# Patient Record
Sex: Female | Born: 1955
Health system: Southern US, Community
[De-identification: ages and names within clinical notes are randomized; demographics above are authoritative.]

## PROBLEM LIST (undated history)

## (undated) DIAGNOSIS — M1990 Unspecified osteoarthritis, unspecified site: Secondary | ICD-10-CM

## (undated) DIAGNOSIS — I10 Essential (primary) hypertension: Secondary | ICD-10-CM

## (undated) HISTORY — PX: TONSILLECTOMY: SUR1361

## (undated) HISTORY — PX: CATARACT EXTRACTION W/ INTRAOCULAR LENS IMPLANT: SHX1309

---

## 2017-12-25 ENCOUNTER — Encounter (INDEPENDENT_AMBULATORY_CARE_PROVIDER_SITE_OTHER): Payer: Self-pay | Admitting: *Deleted

## 2018-12-27 DIAGNOSIS — I10 Essential (primary) hypertension: Secondary | ICD-10-CM | POA: Diagnosis not present

## 2018-12-27 DIAGNOSIS — R7301 Impaired fasting glucose: Secondary | ICD-10-CM | POA: Diagnosis not present

## 2018-12-27 DIAGNOSIS — J309 Allergic rhinitis, unspecified: Secondary | ICD-10-CM | POA: Diagnosis not present

## 2018-12-27 DIAGNOSIS — E782 Mixed hyperlipidemia: Secondary | ICD-10-CM | POA: Diagnosis not present

## 2019-06-13 DIAGNOSIS — M25561 Pain in right knee: Secondary | ICD-10-CM | POA: Diagnosis not present

## 2019-06-13 DIAGNOSIS — M1711 Unilateral primary osteoarthritis, right knee: Secondary | ICD-10-CM | POA: Diagnosis not present

## 2019-06-17 DIAGNOSIS — Z23 Encounter for immunization: Secondary | ICD-10-CM | POA: Diagnosis not present

## 2019-06-27 DIAGNOSIS — E782 Mixed hyperlipidemia: Secondary | ICD-10-CM | POA: Diagnosis not present

## 2019-06-27 DIAGNOSIS — R7301 Impaired fasting glucose: Secondary | ICD-10-CM | POA: Diagnosis not present

## 2019-06-27 DIAGNOSIS — I10 Essential (primary) hypertension: Secondary | ICD-10-CM | POA: Diagnosis not present

## 2019-07-02 ENCOUNTER — Other Ambulatory Visit (HOSPITAL_COMMUNITY): Payer: Self-pay | Admitting: Internal Medicine

## 2019-07-02 ENCOUNTER — Other Ambulatory Visit (HOSPITAL_COMMUNITY): Payer: Self-pay | Admitting: Adult Health Nurse Practitioner

## 2019-07-02 DIAGNOSIS — Z13820 Encounter for screening for osteoporosis: Secondary | ICD-10-CM

## 2019-07-02 DIAGNOSIS — R7303 Prediabetes: Secondary | ICD-10-CM | POA: Diagnosis not present

## 2019-07-02 DIAGNOSIS — R7301 Impaired fasting glucose: Secondary | ICD-10-CM | POA: Diagnosis not present

## 2019-07-02 DIAGNOSIS — E782 Mixed hyperlipidemia: Secondary | ICD-10-CM | POA: Diagnosis not present

## 2019-07-02 DIAGNOSIS — Z1231 Encounter for screening mammogram for malignant neoplasm of breast: Secondary | ICD-10-CM

## 2019-07-02 DIAGNOSIS — J302 Other seasonal allergic rhinitis: Secondary | ICD-10-CM | POA: Diagnosis not present

## 2019-07-02 DIAGNOSIS — I10 Essential (primary) hypertension: Secondary | ICD-10-CM | POA: Diagnosis not present

## 2019-07-02 DIAGNOSIS — M1711 Unilateral primary osteoarthritis, right knee: Secondary | ICD-10-CM | POA: Diagnosis not present

## 2019-07-09 ENCOUNTER — Ambulatory Visit (HOSPITAL_COMMUNITY)
Admission: RE | Admit: 2019-07-09 | Discharge: 2019-07-09 | Disposition: A | Discharge status: Home / Self Care | Payer: Medicare Other | Source: Ambulatory Visit | Attending: Adult Health Nurse Practitioner | Admitting: Adult Health Nurse Practitioner

## 2019-07-09 ENCOUNTER — Other Ambulatory Visit: Payer: Self-pay

## 2019-07-09 DIAGNOSIS — M85851 Other specified disorders of bone density and structure, right thigh: Secondary | ICD-10-CM | POA: Diagnosis not present

## 2019-07-09 DIAGNOSIS — Z13820 Encounter for screening for osteoporosis: Secondary | ICD-10-CM | POA: Insufficient documentation

## 2019-07-09 DIAGNOSIS — Z780 Asymptomatic menopausal state: Secondary | ICD-10-CM | POA: Diagnosis not present

## 2019-09-26 DIAGNOSIS — Z23 Encounter for immunization: Secondary | ICD-10-CM | POA: Diagnosis not present

## 2019-10-25 DIAGNOSIS — Z23 Encounter for immunization: Secondary | ICD-10-CM | POA: Diagnosis not present

## 2019-12-31 DIAGNOSIS — I10 Essential (primary) hypertension: Secondary | ICD-10-CM | POA: Diagnosis not present

## 2019-12-31 DIAGNOSIS — E782 Mixed hyperlipidemia: Secondary | ICD-10-CM | POA: Diagnosis not present

## 2019-12-31 DIAGNOSIS — Z0001 Encounter for general adult medical examination with abnormal findings: Secondary | ICD-10-CM | POA: Diagnosis not present

## 2019-12-31 DIAGNOSIS — M8589 Other specified disorders of bone density and structure, multiple sites: Secondary | ICD-10-CM | POA: Diagnosis not present

## 2019-12-31 DIAGNOSIS — R7301 Impaired fasting glucose: Secondary | ICD-10-CM | POA: Diagnosis not present

## 2019-12-31 DIAGNOSIS — M1711 Unilateral primary osteoarthritis, right knee: Secondary | ICD-10-CM | POA: Diagnosis not present

## 2019-12-31 DIAGNOSIS — J302 Other seasonal allergic rhinitis: Secondary | ICD-10-CM | POA: Diagnosis not present

## 2019-12-31 DIAGNOSIS — R944 Abnormal results of kidney function studies: Secondary | ICD-10-CM | POA: Diagnosis not present

## 2019-12-31 DIAGNOSIS — R7303 Prediabetes: Secondary | ICD-10-CM | POA: Diagnosis not present

## 2020-01-25 LAB — COLOGUARD: COLOGUARD: NEGATIVE

## 2020-02-12 ENCOUNTER — Ambulatory Visit (HOSPITAL_COMMUNITY): Payer: Medicare Other

## 2020-03-02 ENCOUNTER — Other Ambulatory Visit: Payer: Self-pay

## 2020-03-02 ENCOUNTER — Ambulatory Visit (HOSPITAL_COMMUNITY)
Admission: RE | Admit: 2020-03-02 | Discharge: 2020-03-02 | Disposition: A | Discharge status: Home / Self Care | Payer: Medicare Other | Source: Ambulatory Visit | Attending: Internal Medicine | Admitting: Internal Medicine

## 2020-03-02 DIAGNOSIS — Z1231 Encounter for screening mammogram for malignant neoplasm of breast: Secondary | ICD-10-CM | POA: Insufficient documentation

## 2020-06-09 DIAGNOSIS — Z23 Encounter for immunization: Secondary | ICD-10-CM | POA: Diagnosis not present

## 2020-06-25 DIAGNOSIS — Z23 Encounter for immunization: Secondary | ICD-10-CM | POA: Diagnosis not present

## 2020-07-02 DIAGNOSIS — Z6826 Body mass index (BMI) 26.0-26.9, adult: Secondary | ICD-10-CM | POA: Diagnosis not present

## 2020-07-02 DIAGNOSIS — R7301 Impaired fasting glucose: Secondary | ICD-10-CM | POA: Diagnosis not present

## 2020-07-02 DIAGNOSIS — I10 Essential (primary) hypertension: Secondary | ICD-10-CM | POA: Diagnosis not present

## 2020-07-02 DIAGNOSIS — J309 Allergic rhinitis, unspecified: Secondary | ICD-10-CM | POA: Diagnosis not present

## 2020-07-02 DIAGNOSIS — M8589 Other specified disorders of bone density and structure, multiple sites: Secondary | ICD-10-CM | POA: Diagnosis not present

## 2020-07-02 DIAGNOSIS — R319 Hematuria, unspecified: Secondary | ICD-10-CM | POA: Diagnosis not present

## 2020-07-02 DIAGNOSIS — J302 Other seasonal allergic rhinitis: Secondary | ICD-10-CM | POA: Diagnosis not present

## 2020-07-02 DIAGNOSIS — R7303 Prediabetes: Secondary | ICD-10-CM | POA: Diagnosis not present

## 2020-07-02 DIAGNOSIS — M8588 Other specified disorders of bone density and structure, other site: Secondary | ICD-10-CM | POA: Diagnosis not present

## 2020-07-02 DIAGNOSIS — E782 Mixed hyperlipidemia: Secondary | ICD-10-CM | POA: Diagnosis not present

## 2020-07-02 DIAGNOSIS — N390 Urinary tract infection, site not specified: Secondary | ICD-10-CM | POA: Diagnosis not present

## 2020-07-02 DIAGNOSIS — R944 Abnormal results of kidney function studies: Secondary | ICD-10-CM | POA: Diagnosis not present

## 2020-07-07 DIAGNOSIS — R7301 Impaired fasting glucose: Secondary | ICD-10-CM | POA: Diagnosis not present

## 2020-07-07 DIAGNOSIS — E782 Mixed hyperlipidemia: Secondary | ICD-10-CM | POA: Diagnosis not present

## 2020-07-07 DIAGNOSIS — N1831 Chronic kidney disease, stage 3a: Secondary | ICD-10-CM | POA: Diagnosis not present

## 2020-07-07 DIAGNOSIS — R7303 Prediabetes: Secondary | ICD-10-CM | POA: Diagnosis not present

## 2020-07-07 DIAGNOSIS — I129 Hypertensive chronic kidney disease with stage 1 through stage 4 chronic kidney disease, or unspecified chronic kidney disease: Secondary | ICD-10-CM | POA: Diagnosis not present

## 2020-07-07 DIAGNOSIS — M8589 Other specified disorders of bone density and structure, multiple sites: Secondary | ICD-10-CM | POA: Diagnosis not present

## 2020-07-07 DIAGNOSIS — J302 Other seasonal allergic rhinitis: Secondary | ICD-10-CM | POA: Diagnosis not present

## 2020-07-07 DIAGNOSIS — M1711 Unilateral primary osteoarthritis, right knee: Secondary | ICD-10-CM | POA: Diagnosis not present

## 2021-01-01 DIAGNOSIS — R7303 Prediabetes: Secondary | ICD-10-CM | POA: Diagnosis not present

## 2021-01-01 DIAGNOSIS — I10 Essential (primary) hypertension: Secondary | ICD-10-CM | POA: Diagnosis not present

## 2021-01-07 DIAGNOSIS — N1831 Chronic kidney disease, stage 3a: Secondary | ICD-10-CM | POA: Diagnosis not present

## 2021-01-07 DIAGNOSIS — J302 Other seasonal allergic rhinitis: Secondary | ICD-10-CM | POA: Diagnosis not present

## 2021-01-07 DIAGNOSIS — M8589 Other specified disorders of bone density and structure, multiple sites: Secondary | ICD-10-CM | POA: Diagnosis not present

## 2021-01-07 DIAGNOSIS — E782 Mixed hyperlipidemia: Secondary | ICD-10-CM | POA: Diagnosis not present

## 2021-01-07 DIAGNOSIS — R7301 Impaired fasting glucose: Secondary | ICD-10-CM | POA: Diagnosis not present

## 2021-01-07 DIAGNOSIS — I129 Hypertensive chronic kidney disease with stage 1 through stage 4 chronic kidney disease, or unspecified chronic kidney disease: Secondary | ICD-10-CM | POA: Diagnosis not present

## 2021-01-07 DIAGNOSIS — M1711 Unilateral primary osteoarthritis, right knee: Secondary | ICD-10-CM | POA: Diagnosis not present

## 2021-06-09 DIAGNOSIS — Z23 Encounter for immunization: Secondary | ICD-10-CM | POA: Diagnosis not present

## 2021-06-25 DIAGNOSIS — Z23 Encounter for immunization: Secondary | ICD-10-CM | POA: Diagnosis not present

## 2021-07-05 DIAGNOSIS — M8589 Other specified disorders of bone density and structure, multiple sites: Secondary | ICD-10-CM | POA: Diagnosis not present

## 2021-07-05 DIAGNOSIS — E782 Mixed hyperlipidemia: Secondary | ICD-10-CM | POA: Diagnosis not present

## 2021-07-12 ENCOUNTER — Other Ambulatory Visit (HOSPITAL_COMMUNITY): Payer: Self-pay | Admitting: Internal Medicine

## 2021-07-12 DIAGNOSIS — R7301 Impaired fasting glucose: Secondary | ICD-10-CM | POA: Diagnosis not present

## 2021-07-12 DIAGNOSIS — N1831 Chronic kidney disease, stage 3a: Secondary | ICD-10-CM | POA: Diagnosis not present

## 2021-07-12 DIAGNOSIS — Z0001 Encounter for general adult medical examination with abnormal findings: Secondary | ICD-10-CM | POA: Diagnosis not present

## 2021-07-12 DIAGNOSIS — M8589 Other specified disorders of bone density and structure, multiple sites: Secondary | ICD-10-CM

## 2021-07-12 DIAGNOSIS — E782 Mixed hyperlipidemia: Secondary | ICD-10-CM | POA: Diagnosis not present

## 2021-07-12 DIAGNOSIS — I129 Hypertensive chronic kidney disease with stage 1 through stage 4 chronic kidney disease, or unspecified chronic kidney disease: Secondary | ICD-10-CM | POA: Diagnosis not present

## 2021-07-12 DIAGNOSIS — Z1231 Encounter for screening mammogram for malignant neoplasm of breast: Secondary | ICD-10-CM

## 2021-07-12 DIAGNOSIS — M1711 Unilateral primary osteoarthritis, right knee: Secondary | ICD-10-CM | POA: Diagnosis not present

## 2021-07-12 DIAGNOSIS — J302 Other seasonal allergic rhinitis: Secondary | ICD-10-CM | POA: Diagnosis not present

## 2021-07-21 ENCOUNTER — Ambulatory Visit (HOSPITAL_COMMUNITY)
Admission: RE | Admit: 2021-07-21 | Discharge: 2021-07-21 | Disposition: A | Discharge status: Home / Self Care | Payer: Medicare Other | Source: Ambulatory Visit | Attending: Internal Medicine | Admitting: Internal Medicine

## 2021-07-21 ENCOUNTER — Other Ambulatory Visit: Payer: Self-pay

## 2021-07-21 DIAGNOSIS — Z1231 Encounter for screening mammogram for malignant neoplasm of breast: Secondary | ICD-10-CM | POA: Insufficient documentation

## 2021-07-22 ENCOUNTER — Ambulatory Visit (HOSPITAL_COMMUNITY)
Admission: RE | Admit: 2021-07-22 | Discharge: 2021-07-22 | Disposition: A | Discharge status: Home / Self Care | Payer: Medicare Other | Source: Ambulatory Visit | Attending: Internal Medicine | Admitting: Internal Medicine

## 2021-07-22 DIAGNOSIS — Z780 Asymptomatic menopausal state: Secondary | ICD-10-CM | POA: Diagnosis not present

## 2021-07-22 DIAGNOSIS — M8589 Other specified disorders of bone density and structure, multiple sites: Secondary | ICD-10-CM | POA: Insufficient documentation

## 2021-07-27 ENCOUNTER — Other Ambulatory Visit (HOSPITAL_COMMUNITY): Payer: Self-pay | Admitting: Internal Medicine

## 2021-07-27 DIAGNOSIS — R928 Other abnormal and inconclusive findings on diagnostic imaging of breast: Secondary | ICD-10-CM

## 2021-08-17 ENCOUNTER — Ambulatory Visit (HOSPITAL_COMMUNITY)
Admission: RE | Admit: 2021-08-17 | Discharge: 2021-08-17 | Disposition: A | Discharge status: Home / Self Care | Payer: Medicare Other | Source: Ambulatory Visit | Attending: Internal Medicine | Admitting: Internal Medicine

## 2021-08-17 ENCOUNTER — Other Ambulatory Visit: Payer: Self-pay

## 2021-08-17 DIAGNOSIS — R928 Other abnormal and inconclusive findings on diagnostic imaging of breast: Secondary | ICD-10-CM | POA: Insufficient documentation

## 2021-08-17 DIAGNOSIS — R922 Inconclusive mammogram: Secondary | ICD-10-CM | POA: Diagnosis not present

## 2021-09-14 DIAGNOSIS — H35033 Hypertensive retinopathy, bilateral: Secondary | ICD-10-CM | POA: Diagnosis not present

## 2021-09-28 DIAGNOSIS — H25811 Combined forms of age-related cataract, right eye: Secondary | ICD-10-CM | POA: Diagnosis not present

## 2021-09-28 DIAGNOSIS — Z01818 Encounter for other preprocedural examination: Secondary | ICD-10-CM | POA: Diagnosis not present

## 2021-09-28 DIAGNOSIS — H25812 Combined forms of age-related cataract, left eye: Secondary | ICD-10-CM | POA: Diagnosis not present

## 2021-09-28 DIAGNOSIS — H18523 Epithelial (juvenile) corneal dystrophy, bilateral: Secondary | ICD-10-CM | POA: Diagnosis not present

## 2021-10-07 DIAGNOSIS — H25811 Combined forms of age-related cataract, right eye: Secondary | ICD-10-CM | POA: Diagnosis not present

## 2021-10-21 DIAGNOSIS — H25812 Combined forms of age-related cataract, left eye: Secondary | ICD-10-CM | POA: Diagnosis not present

## 2021-10-21 DIAGNOSIS — H2512 Age-related nuclear cataract, left eye: Secondary | ICD-10-CM | POA: Diagnosis not present

## 2021-10-21 DIAGNOSIS — H25811 Combined forms of age-related cataract, right eye: Secondary | ICD-10-CM | POA: Diagnosis not present

## 2021-10-27 DIAGNOSIS — H2512 Age-related nuclear cataract, left eye: Secondary | ICD-10-CM | POA: Diagnosis not present

## 2022-01-06 DIAGNOSIS — E782 Mixed hyperlipidemia: Secondary | ICD-10-CM | POA: Diagnosis not present

## 2022-01-06 DIAGNOSIS — R7301 Impaired fasting glucose: Secondary | ICD-10-CM | POA: Diagnosis not present

## 2022-01-11 DIAGNOSIS — M8589 Other specified disorders of bone density and structure, multiple sites: Secondary | ICD-10-CM | POA: Diagnosis not present

## 2022-01-11 DIAGNOSIS — N1831 Chronic kidney disease, stage 3a: Secondary | ICD-10-CM | POA: Diagnosis not present

## 2022-01-11 DIAGNOSIS — E663 Overweight: Secondary | ICD-10-CM | POA: Diagnosis not present

## 2022-01-11 DIAGNOSIS — R7301 Impaired fasting glucose: Secondary | ICD-10-CM | POA: Diagnosis not present

## 2022-01-11 DIAGNOSIS — J302 Other seasonal allergic rhinitis: Secondary | ICD-10-CM | POA: Diagnosis not present

## 2022-01-11 DIAGNOSIS — I129 Hypertensive chronic kidney disease with stage 1 through stage 4 chronic kidney disease, or unspecified chronic kidney disease: Secondary | ICD-10-CM | POA: Diagnosis not present

## 2022-01-11 DIAGNOSIS — E782 Mixed hyperlipidemia: Secondary | ICD-10-CM | POA: Diagnosis not present

## 2022-01-11 DIAGNOSIS — Z6826 Body mass index (BMI) 26.0-26.9, adult: Secondary | ICD-10-CM | POA: Diagnosis not present

## 2022-01-11 DIAGNOSIS — M1711 Unilateral primary osteoarthritis, right knee: Secondary | ICD-10-CM | POA: Diagnosis not present

## 2022-01-12 ENCOUNTER — Other Ambulatory Visit: Payer: Self-pay | Admitting: Internal Medicine

## 2022-01-12 ENCOUNTER — Other Ambulatory Visit (HOSPITAL_COMMUNITY): Payer: Self-pay | Admitting: Internal Medicine

## 2022-01-12 DIAGNOSIS — E782 Mixed hyperlipidemia: Secondary | ICD-10-CM

## 2022-01-14 ENCOUNTER — Other Ambulatory Visit: Payer: Self-pay | Admitting: Internal Medicine

## 2022-01-14 ENCOUNTER — Other Ambulatory Visit (HOSPITAL_COMMUNITY): Payer: Self-pay | Admitting: Internal Medicine

## 2022-01-14 DIAGNOSIS — E782 Mixed hyperlipidemia: Secondary | ICD-10-CM

## 2022-02-21 ENCOUNTER — Inpatient Hospital Stay (HOSPITAL_COMMUNITY): Admission: RE | Admit: 2022-02-21 | Payer: Medicare Other | Source: Ambulatory Visit

## 2022-02-21 ENCOUNTER — Encounter (HOSPITAL_COMMUNITY): Payer: Self-pay

## 2022-03-10 DIAGNOSIS — H35033 Hypertensive retinopathy, bilateral: Secondary | ICD-10-CM | POA: Diagnosis not present

## 2022-05-04 ENCOUNTER — Ambulatory Visit (HOSPITAL_COMMUNITY)
Admission: RE | Admit: 2022-05-04 | Discharge: 2022-05-04 | Disposition: A | Discharge status: Home / Self Care | Payer: Medicare Other | Source: Ambulatory Visit | Attending: Internal Medicine | Admitting: Internal Medicine

## 2022-05-04 DIAGNOSIS — E782 Mixed hyperlipidemia: Secondary | ICD-10-CM | POA: Insufficient documentation

## 2022-05-28 DIAGNOSIS — Z23 Encounter for immunization: Secondary | ICD-10-CM | POA: Diagnosis not present

## 2022-06-29 IMAGING — MG MM DIGITAL DIAGNOSTIC UNILAT*L* W/ TOMO W/ CAD
4 series · 4 of 12 positions shown · non-contrast
Comparison: Previous exams.

CLINICAL DATA: Screening recall for possible left breast asymmetry.

EXAM:
DIGITAL DIAGNOSTIC UNILATERAL LEFT MAMMOGRAM WITH TOMOSYNTHESIS AND
CAD
TECHNIQUE: Left digital diagnostic mammography and breast tomosynthesis was
performed. The images were evaluated with computer-aided detection.

[L ML synth-2D]
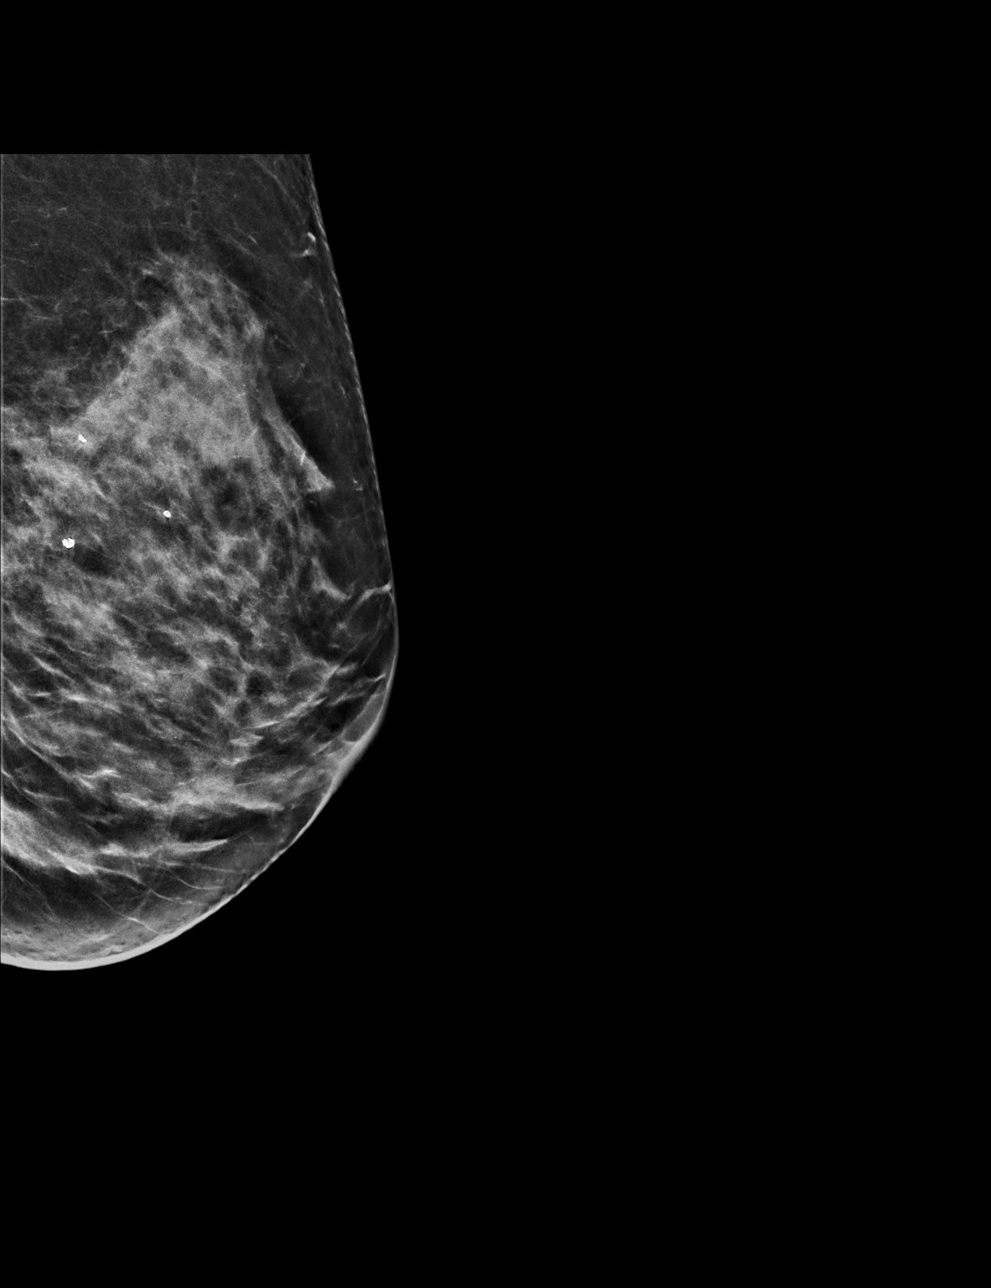

[L CC synth-2D]
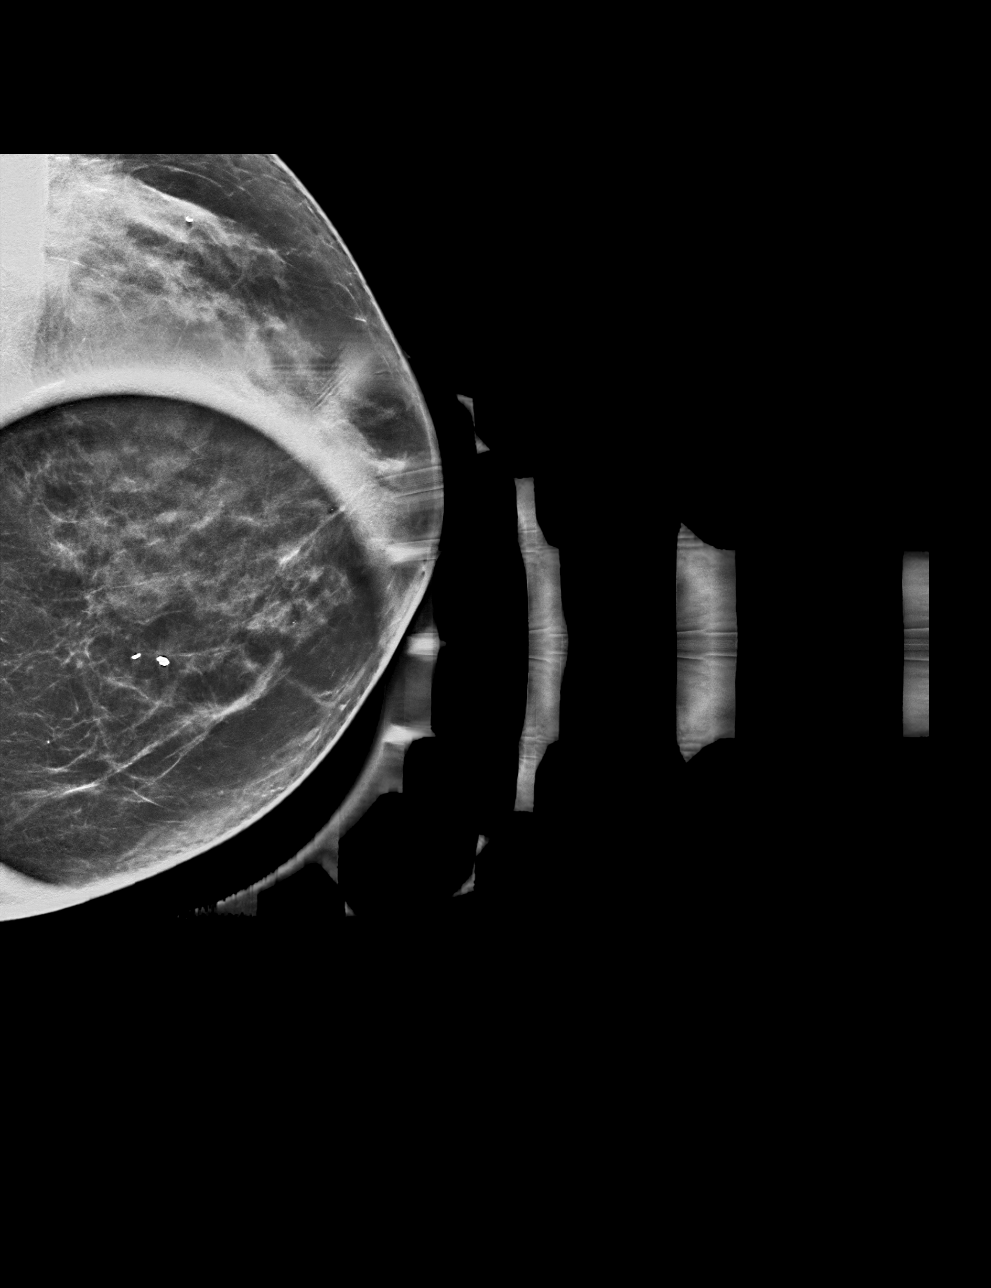

[L CC tomo · tomo slice 27/54.0]
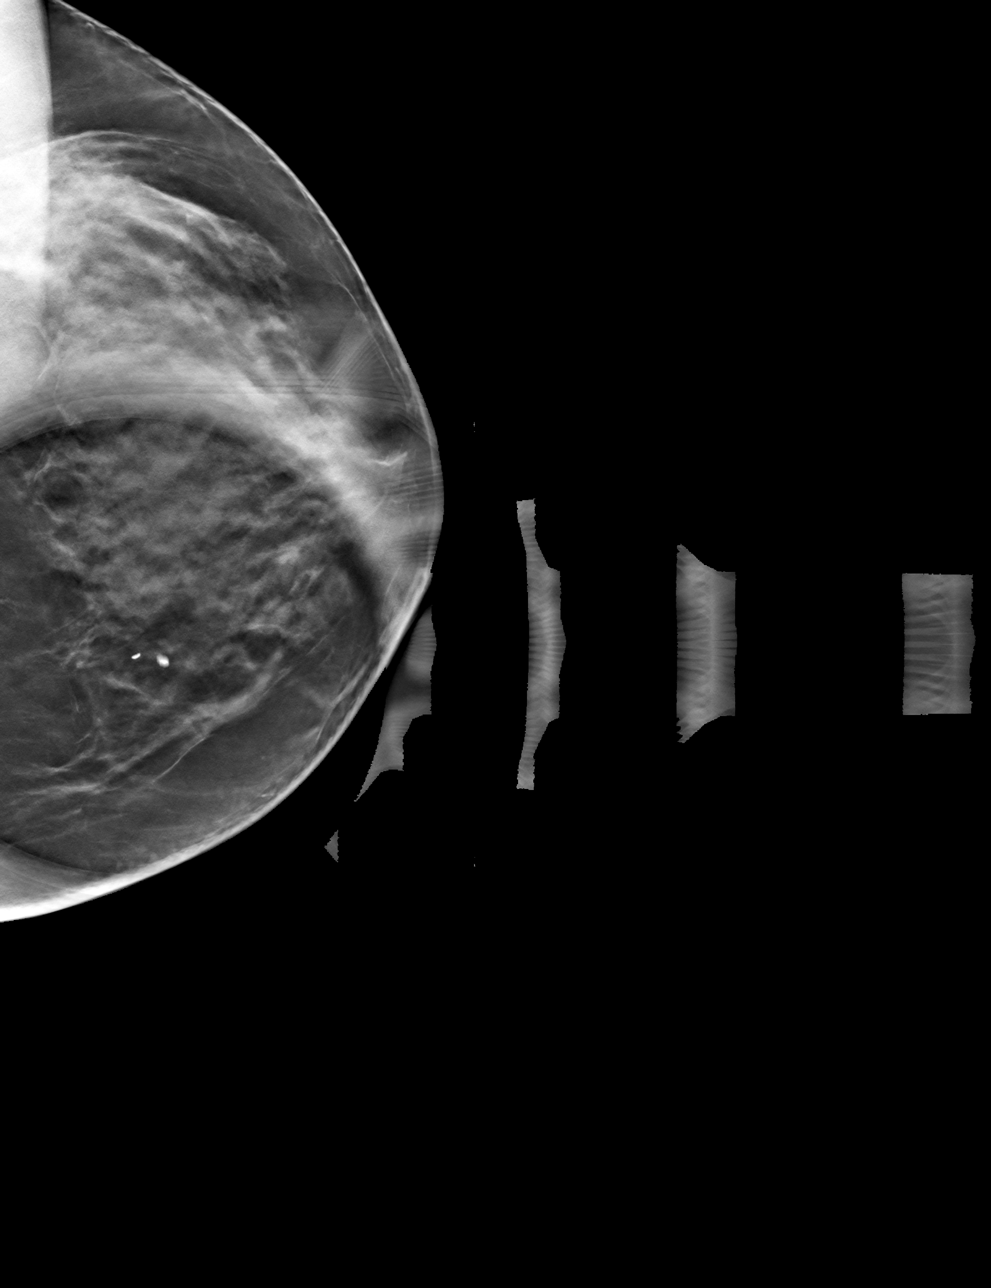

[L ML tomo · tomo slice 29/56.0]
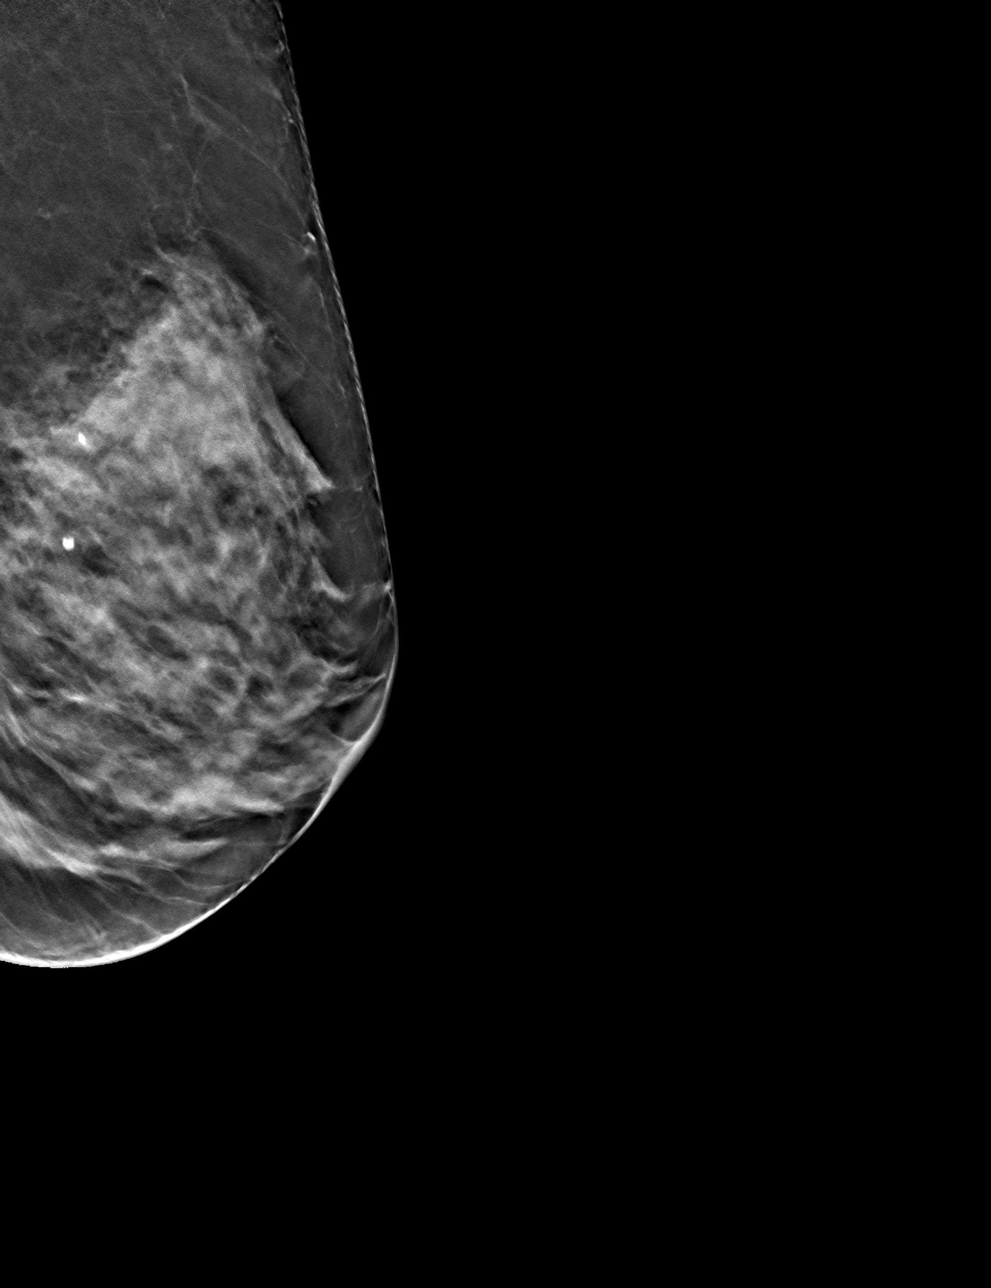

[4 of 12 positions shown; findings below may reference images not displayed]

ACR Breast Density Category c: The breast tissue is heterogeneously
dense, which may obscure small masses.
FINDINGS: Additional tomograms were performed of the left breast. The
initially questioned possible left breast asymmetry resolves on the
additional imaging with findings compatible with an area of
overlapping fibroglandular tissue. There is no mammographic evidence
of malignancy in the left breast.
IMPRESSION: No mammographic evidence of malignancy in the left breast.

RECOMMENDATION:
Screening mammogram in one year.(Code:[id number])

I have discussed the findings and recommendations with the patient.
If applicable, a reminder letter will be sent to the patient
regarding the next appointment.

BI-RADS CATEGORY  1: Negative.

## 2022-07-11 DIAGNOSIS — R7301 Impaired fasting glucose: Secondary | ICD-10-CM | POA: Diagnosis not present

## 2022-07-11 DIAGNOSIS — E782 Mixed hyperlipidemia: Secondary | ICD-10-CM | POA: Diagnosis not present

## 2022-07-15 DIAGNOSIS — M8589 Other specified disorders of bone density and structure, multiple sites: Secondary | ICD-10-CM | POA: Diagnosis not present

## 2022-07-15 DIAGNOSIS — R7301 Impaired fasting glucose: Secondary | ICD-10-CM | POA: Diagnosis not present

## 2022-07-15 DIAGNOSIS — J302 Other seasonal allergic rhinitis: Secondary | ICD-10-CM | POA: Diagnosis not present

## 2022-07-15 DIAGNOSIS — Z0000 Encounter for general adult medical examination without abnormal findings: Secondary | ICD-10-CM | POA: Diagnosis not present

## 2022-07-15 DIAGNOSIS — N1831 Chronic kidney disease, stage 3a: Secondary | ICD-10-CM | POA: Diagnosis not present

## 2022-07-15 DIAGNOSIS — M1711 Unilateral primary osteoarthritis, right knee: Secondary | ICD-10-CM | POA: Diagnosis not present

## 2022-07-15 DIAGNOSIS — I129 Hypertensive chronic kidney disease with stage 1 through stage 4 chronic kidney disease, or unspecified chronic kidney disease: Secondary | ICD-10-CM | POA: Diagnosis not present

## 2022-07-15 DIAGNOSIS — E782 Mixed hyperlipidemia: Secondary | ICD-10-CM | POA: Diagnosis not present

## 2022-07-28 DIAGNOSIS — M545 Low back pain, unspecified: Secondary | ICD-10-CM | POA: Diagnosis not present

## 2022-09-12 DIAGNOSIS — M5451 Vertebrogenic low back pain: Secondary | ICD-10-CM | POA: Diagnosis not present

## 2022-09-26 DIAGNOSIS — M545 Low back pain, unspecified: Secondary | ICD-10-CM | POA: Diagnosis not present

## 2022-09-26 DIAGNOSIS — M5451 Vertebrogenic low back pain: Secondary | ICD-10-CM | POA: Diagnosis not present

## 2022-10-03 DIAGNOSIS — M5416 Radiculopathy, lumbar region: Secondary | ICD-10-CM | POA: Diagnosis not present

## 2022-10-03 DIAGNOSIS — M47816 Spondylosis without myelopathy or radiculopathy, lumbar region: Secondary | ICD-10-CM | POA: Diagnosis not present

## 2022-10-19 DIAGNOSIS — M5416 Radiculopathy, lumbar region: Secondary | ICD-10-CM | POA: Diagnosis not present

## 2022-11-03 DIAGNOSIS — M5416 Radiculopathy, lumbar region: Secondary | ICD-10-CM | POA: Diagnosis not present

## 2022-11-03 DIAGNOSIS — M47816 Spondylosis without myelopathy or radiculopathy, lumbar region: Secondary | ICD-10-CM | POA: Diagnosis not present

## 2022-11-15 DIAGNOSIS — M5416 Radiculopathy, lumbar region: Secondary | ICD-10-CM | POA: Diagnosis not present

## 2022-11-21 DIAGNOSIS — M5416 Radiculopathy, lumbar region: Secondary | ICD-10-CM | POA: Diagnosis not present

## 2022-11-23 DIAGNOSIS — M5416 Radiculopathy, lumbar region: Secondary | ICD-10-CM | POA: Diagnosis not present

## 2022-11-28 DIAGNOSIS — M5416 Radiculopathy, lumbar region: Secondary | ICD-10-CM | POA: Diagnosis not present

## 2022-11-30 DIAGNOSIS — M5416 Radiculopathy, lumbar region: Secondary | ICD-10-CM | POA: Diagnosis not present

## 2022-12-05 DIAGNOSIS — M5416 Radiculopathy, lumbar region: Secondary | ICD-10-CM | POA: Diagnosis not present

## 2022-12-07 DIAGNOSIS — M5416 Radiculopathy, lumbar region: Secondary | ICD-10-CM | POA: Diagnosis not present

## 2022-12-12 DIAGNOSIS — M5416 Radiculopathy, lumbar region: Secondary | ICD-10-CM | POA: Diagnosis not present

## 2022-12-14 DIAGNOSIS — M5416 Radiculopathy, lumbar region: Secondary | ICD-10-CM | POA: Diagnosis not present

## 2022-12-19 DIAGNOSIS — M5416 Radiculopathy, lumbar region: Secondary | ICD-10-CM | POA: Diagnosis not present

## 2022-12-21 DIAGNOSIS — M5416 Radiculopathy, lumbar region: Secondary | ICD-10-CM | POA: Diagnosis not present

## 2022-12-26 DIAGNOSIS — M5416 Radiculopathy, lumbar region: Secondary | ICD-10-CM | POA: Diagnosis not present

## 2022-12-27 DIAGNOSIS — M5416 Radiculopathy, lumbar region: Secondary | ICD-10-CM | POA: Diagnosis not present

## 2022-12-27 DIAGNOSIS — M47896 Other spondylosis, lumbar region: Secondary | ICD-10-CM | POA: Diagnosis not present

## 2023-01-02 DIAGNOSIS — E782 Mixed hyperlipidemia: Secondary | ICD-10-CM | POA: Diagnosis not present

## 2023-01-02 DIAGNOSIS — R7301 Impaired fasting glucose: Secondary | ICD-10-CM | POA: Diagnosis not present

## 2023-01-03 ENCOUNTER — Other Ambulatory Visit (HOSPITAL_COMMUNITY): Payer: Self-pay | Admitting: Internal Medicine

## 2023-01-03 DIAGNOSIS — Z1231 Encounter for screening mammogram for malignant neoplasm of breast: Secondary | ICD-10-CM

## 2023-01-10 DIAGNOSIS — I10 Essential (primary) hypertension: Secondary | ICD-10-CM | POA: Diagnosis not present

## 2023-01-10 DIAGNOSIS — E782 Mixed hyperlipidemia: Secondary | ICD-10-CM | POA: Diagnosis not present

## 2023-01-10 DIAGNOSIS — R7303 Prediabetes: Secondary | ICD-10-CM | POA: Diagnosis not present

## 2023-01-12 DIAGNOSIS — M7138 Other bursal cyst, other site: Secondary | ICD-10-CM | POA: Diagnosis not present

## 2023-01-12 DIAGNOSIS — Z6826 Body mass index (BMI) 26.0-26.9, adult: Secondary | ICD-10-CM | POA: Diagnosis not present

## 2023-01-13 ENCOUNTER — Other Ambulatory Visit: Payer: Self-pay | Admitting: Neurosurgery

## 2023-01-13 ENCOUNTER — Ambulatory Visit (HOSPITAL_COMMUNITY)
Admission: RE | Admit: 2023-01-13 | Discharge: 2023-01-13 | Disposition: A | Discharge status: Home / Self Care | Payer: Medicare Other | Source: Ambulatory Visit | Attending: Internal Medicine | Admitting: Internal Medicine

## 2023-01-13 DIAGNOSIS — Z1231 Encounter for screening mammogram for malignant neoplasm of breast: Secondary | ICD-10-CM | POA: Diagnosis not present

## 2023-01-16 DIAGNOSIS — J302 Other seasonal allergic rhinitis: Secondary | ICD-10-CM | POA: Diagnosis not present

## 2023-01-16 DIAGNOSIS — I129 Hypertensive chronic kidney disease with stage 1 through stage 4 chronic kidney disease, or unspecified chronic kidney disease: Secondary | ICD-10-CM | POA: Diagnosis not present

## 2023-01-16 DIAGNOSIS — N1831 Chronic kidney disease, stage 3a: Secondary | ICD-10-CM | POA: Diagnosis not present

## 2023-01-16 DIAGNOSIS — R7303 Prediabetes: Secondary | ICD-10-CM | POA: Diagnosis not present

## 2023-01-16 DIAGNOSIS — M545 Low back pain, unspecified: Secondary | ICD-10-CM | POA: Diagnosis not present

## 2023-01-16 DIAGNOSIS — E782 Mixed hyperlipidemia: Secondary | ICD-10-CM | POA: Diagnosis not present

## 2023-01-16 DIAGNOSIS — R918 Other nonspecific abnormal finding of lung field: Secondary | ICD-10-CM | POA: Diagnosis not present

## 2023-01-16 DIAGNOSIS — I700 Atherosclerosis of aorta: Secondary | ICD-10-CM | POA: Diagnosis not present

## 2023-01-16 DIAGNOSIS — M1711 Unilateral primary osteoarthritis, right knee: Secondary | ICD-10-CM | POA: Diagnosis not present

## 2023-01-16 DIAGNOSIS — M8580 Other specified disorders of bone density and structure, unspecified site: Secondary | ICD-10-CM | POA: Diagnosis not present

## 2023-01-16 DIAGNOSIS — Z7182 Exercise counseling: Secondary | ICD-10-CM | POA: Diagnosis not present

## 2023-01-16 DIAGNOSIS — Z713 Dietary counseling and surveillance: Secondary | ICD-10-CM | POA: Diagnosis not present

## 2023-01-17 NOTE — Progress Notes (Signed)
     Your surgery and Pre-Admission testing visit will be at [facility] located at [address], [place], [place] [address].  Please let all your doctors (i.e., Primary Care Physician, Cardiologist, Endocrinologist, Pulmonologist) know you are having surgery. You may need clearance for surgery. If you are on blood thinners, notify your surgeon and ask the doctor who prescribed them how long to hold them before surgery.  If you have had a heart test, such as an EKG, stress test, heart ultrasound, etc., or lab work performed outside of [facility], please bring copies of these tests to your Pre-Admission testing, if possible.  These departments may contact you before the day of surgery:  Pre-Service Center - insurance/ billing: [phone] Pharmacy- to review your medications: [phone] Pre-Admission Testing- to set an appointment for your visit: [phone]  (Often, these numbers show up as "SPAM" on your phone)  The Pre-Admission Testing (PAT) visit focuses on Anesthesia for your upcoming surgery.  You do NOT need to fast; take your medications as usual. Please arrive 30 minutes early to allow for parking and admitting.  The visit may last up to an hour. Bring a photo ID and medical insurance card. Reschedule if you are sick. ([phone]) and please, NO children under age 16 at the visit.  During the PAT visit:  We will review your medical and surgical history.   You will receive pre-operative instructions, including the time of arrival at the hospital and surgical start time.  We will review what medication(s) you can take on the day of surgery.  After speaking with the nurse, you will have blood drawn and, if needed, a chest x-ray and EKG.  Most lab results from your doctor are good for 30 days, Hemoglobin A1C is good for 60 days. If you cannot talk to the Pharmacy, bring your medications or a list of them to the PST visit.   Infection control for the [facility] requires: All fingernail and toenail products should be removed before the day of surgery.  (SNS, Acrylic, Gel, Polish, Stickers, Press on, and Poly gel nails.)   Parking information:  Address: [facility] - [address], [place], [place] [address]  Please look for signs for entrance A off of [address]. Free valet parking is available Monday-Friday 05:30am-06:00pm     

## 2023-01-17 NOTE — Pre-Procedure Instructions (Signed)
Surgical Instructions    Your procedure is scheduled on January 23, 2023.  Report to [facility] Main Entrance "A" at 12:40 P.M., then check in with the Admitting office.  Call this number if you have problems the morning of surgery:  [phone]  If you have any questions prior to your surgery date call [phone]: Open Monday-Friday 8am-4pm If you experience any cold or flu symptoms such as cough, fever, chills, shortness of breath, etc. between now and your scheduled surgery, please notify us at the above number.     Remember:  Do not eat or drink after midnight the night before your surgery     Take these medicines the morning of surgery with A SIP OF WATER:  NONE   As of today, STOP taking any Aspirin (unless otherwise instructed by your surgeon) Aleve, Naproxen, Ibuprofen, Motrin, Advil, Goody's, BC's, all herbal medications, fish oil, and all vitamins.                     Do NOT Smoke (Tobacco/Vaping) for 24 hours prior to your procedure.  If you use a CPAP at night, you may bring your mask/headgear for your overnight stay.   Contacts, glasses, piercing's, hearing aid's, dentures or partials may not be worn into surgery, please bring cases for these belongings.    For patients admitted to the hospital, discharge time will be determined by your treatment team.   Patients discharged the day of surgery will not be allowed to drive home, and someone needs to stay with them for 24 hours.  SURGICAL WAITING ROOM VISITATION Patients having surgery or a procedure may have no more than 2 support people in the waiting area - these visitors may rotate.   Children under the age of 6 must have an adult with them who is not the patient. If the patient needs to stay at the hospital during part of their recovery, the visitor guidelines for inpatient rooms apply. Pre-op nurse will coordinate an appropriate time for 1 support person to accompany patient in pre-op.  This support person may  not rotate.   Please refer to the [facility] website for the visitor guidelines for Inpatients (after your surgery is over and you are in a regular room).   If you received a COVID test during your pre-op visit  it is requested that you wear a mask when out in public, stay away from anyone that may not be feeling well and notify your surgeon if you develop symptoms. If you have been in contact with anyone that has tested positive in the last 10 days please notify you surgeon.    Pre-operative 5 CHG Bath Instructions   You can play a key role in reducing the risk of infection after surgery. Your skin needs to be as free of germs as possible. You can reduce the number of germs on your skin by washing with CHG (chlorhexidine gluconate) soap before surgery. CHG is an antiseptic soap that kills germs and continues to kill germs even after washing.   DO NOT use if you have an allergy to chlorhexidine/CHG or antibacterial soaps. If your skin becomes reddened or irritated, stop using the CHG and notify one of our RNs at [phone].   Please shower with the CHG soap starting 4 days before surgery using the following schedule:     Please keep in mind the following:  DO NOT shave, including legs and underarms, starting the day of your first shower.  You may shave your face at any point before/day of surgery.  Place clean sheets on your bed the day you start using CHG soap. Use a clean washcloth (not used since being washed) for each shower. DO NOT sleep with pets once you start using the CHG.   CHG Shower Instructions:  If you choose to wash your hair and private area, wash first with your normal shampoo/soap.  After you use shampoo/soap, rinse your hair and body thoroughly to remove shampoo/soap residue.  Turn the water OFF and apply about 3 tablespoons (45 ml) of CHG soap to a CLEAN washcloth.  Apply CHG soap ONLY FROM YOUR NECK DOWN TO YOUR TOES (washing for 3-5 minutes)  DO NOT use CHG soap  on face, private areas, open wounds, or sores.  Pay special attention to the area where your surgery is being performed.  If you are having back surgery, having someone wash your back for you may be helpful. Wait 2 minutes after CHG soap is applied, then you may rinse off the CHG soap.  Pat dry with a clean towel  Put on clean clothes/pajamas   If you choose to wear lotion, please use ONLY the CHG-compatible lotions on the back of this paper.     Additional instructions for the day of surgery: DO NOT APPLY any lotions, deodorants, cologne, or perfumes.  Do not wear jewelry or makeup  Do not wear nail polish, gel polish, artificial nails, or any other type of covering on natural nails (fingers and toes) Do not bring valuables to the hospital. [facility] is not responsible for any belongings or valuables. Put on clean/comfortable clothes.  Brush your teeth.  Ask your nurse before applying any prescription medications to the skin.      CHG Compatible Lotions   Aveeno Moisturizing lotion  Cetaphil Moisturizing Cream  Cetaphil Moisturizing Lotion  Clairol Herbal Essence Moisturizing Lotion, Dry Skin  Clairol Herbal Essence Moisturizing Lotion, Extra Dry Skin  Clairol Herbal Essence Moisturizing Lotion, Normal Skin  Curel Age Defying Therapeutic Moisturizing Lotion with Alpha Hydroxy  Curel Extreme Care Body Lotion  Curel Soothing Hands Moisturizing Hand Lotion  Curel Therapeutic Moisturizing Cream, Fragrance-Free  Curel Therapeutic Moisturizing Lotion, Fragrance-Free  Curel Therapeutic Moisturizing Lotion, Original Formula  Eucerin Daily Replenishing Lotion  Eucerin Dry Skin Therapy Plus Alpha Hydroxy Crme  Eucerin Dry Skin Therapy Plus Alpha Hydroxy Lotion  Eucerin Original Crme  Eucerin Original Lotion  Eucerin Plus Crme Eucerin Plus Lotion  Eucerin TriLipid Replenishing Lotion  Keri Anti-Bacterial Hand Lotion  Keri Deep Conditioning Original Lotion Dry Skin Formula Softly  Scented  Keri Deep Conditioning Original Lotion, Fragrance Free Sensitive Skin Formula  Keri Lotion Fast Absorbing Fragrance Free Sensitive Skin Formula  Keri Lotion Fast Absorbing Softly Scented Dry Skin Formula  Keri Original Lotion  Keri Skin Renewal Lotion Keri Silky Smooth Lotion  Keri Silky Smooth Sensitive Skin Lotion  Nivea Body Creamy Conditioning Oil  Nivea Body Extra Enriched Lotion  Nivea Body Original Lotion  Nivea Body Sheer Moisturizing Lotion Nivea Crme  Nivea Skin Firming Lotion  NutraDerm 30 Skin Lotion  NutraDerm Skin Lotion  NutraDerm Therapeutic Skin Cream  NutraDerm Therapeutic Skin Lotion  ProShield Protective Hand Cream  Provon moisturizing lotion   Please read over the following fact sheets that you were given.

## 2023-01-18 ENCOUNTER — Other Ambulatory Visit: Payer: Self-pay

## 2023-01-18 ENCOUNTER — Encounter (HOSPITAL_COMMUNITY): Payer: Self-pay

## 2023-01-18 ENCOUNTER — Encounter (HOSPITAL_COMMUNITY)
Admission: RE | Admit: 2023-01-18 | Discharge: 2023-01-18 | Disposition: A | Discharge status: Home / Self Care | Payer: Medicare Other | Source: Ambulatory Visit | Attending: Neurosurgery | Admitting: Neurosurgery

## 2023-01-18 VITALS — BP 132/67 | HR 97 | Temp 98.5°F | Resp 18 | Ht 67.0 in | Wt 168.2 lb

## 2023-01-18 DIAGNOSIS — Z01818 Encounter for other preprocedural examination: Secondary | ICD-10-CM | POA: Diagnosis not present

## 2023-01-18 DIAGNOSIS — I2510 Atherosclerotic heart disease of native coronary artery without angina pectoris: Secondary | ICD-10-CM | POA: Insufficient documentation

## 2023-01-18 HISTORY — DX: Essential (primary) hypertension: I10

## 2023-01-18 HISTORY — DX: Unspecified osteoarthritis, unspecified site: M19.90

## 2023-01-18 LAB — BASIC METABOLIC PANEL
Anion gap: 10 (ref 5–15)
BUN: 22 mg/dL (ref 8–23)
CO2: 25 mmol/L (ref 22–32)
Calcium: 9.8 mg/dL (ref 8.9–10.3)
Chloride: 102 mmol/L (ref 98–111)
Creatinine, Ser: 0.92 mg/dL (ref 0.44–1.00)
GFR, Estimated: >60 mL/min (ref >60)
Glucose, Bld: 101 mg/dL — ABNORMAL HIGH (ref 70–99)
Potassium: 4.4 mmol/L (ref 3.5–5.1)
Sodium: 137 mmol/L (ref 135–145)

## 2023-01-18 LAB — CBC
HCT: 37.8 % (ref 36.0–46.0)
Hemoglobin: 12.3 g/dL (ref 12.0–15.0)
MCH: 28.5 pg (ref 26.0–34.0)
MCHC: 32.5 g/dL (ref 30.0–36.0)
MCV: 87.5 fL (ref 80.0–100.0)
Platelets: 295 10*3/uL (ref 150–400)
RBC: 4.32 MIL/uL (ref 3.87–5.11)
RDW: 13 % (ref 11.5–15.5)
WBC: 8.2 10*3/uL (ref 4.0–10.5)
nRBC: 0 % (ref 0.0–0.2)

## 2023-01-18 LAB — SURGICAL PCR SCREEN
MRSA, PCR: NEGATIVE
Staphylococcus aureus: NEGATIVE

## 2023-01-18 NOTE — Progress Notes (Signed)
PCP - Dr. Kathleene Hazel. Recruitment consultant - Denies  PPM/ICD - Denies Device Orders - n/a Rep Notified - n/a  Chest x-ray - Denies EKG - 01/18/2023 Stress Test - Denies ECHO - Denies Cardiac Cath - Denies  Sleep Study - Denies CPAP - n/a  No DM  Last dose of GLP1 agonist- n/a GLP1 instructions:  n/a  Blood Thinner Instructions: n/a Aspirin Instructions: n/a  NPO after midnight  COVID TEST- n/a   Anesthesia review: No.   Patient denies shortness of breath, fever, cough and chest pain at PAT appointment. Pt denies any respiratory illness/infection in the last two months.   All instructions explained to the patient, with a verbal understanding of the material. Patient agrees to go over the instructions while at home for a better understanding. Patient also instructed to self quarantine after being tested for COVID-19. The opportunity to ask questions was provided.

## 2023-01-23 ENCOUNTER — Observation Stay (HOSPITAL_COMMUNITY)
Admission: RE | Admit: 2023-01-23 | Discharge: 2023-01-23 | Disposition: A | Discharge status: Home / Self Care | Payer: Medicare Other | Attending: Neurosurgery | Admitting: Neurosurgery

## 2023-01-23 ENCOUNTER — Other Ambulatory Visit: Payer: Self-pay

## 2023-01-23 ENCOUNTER — Ambulatory Visit (HOSPITAL_COMMUNITY): Payer: Medicare Other

## 2023-01-23 ENCOUNTER — Encounter (HOSPITAL_COMMUNITY): Payer: Self-pay | Admitting: Neurosurgery

## 2023-01-23 ENCOUNTER — Ambulatory Visit (HOSPITAL_COMMUNITY)
Admission: RE | Disposition: A | Discharge status: Home / Self Care | Payer: Self-pay | Source: Home / Self Care | Attending: Neurosurgery

## 2023-01-23 ENCOUNTER — Ambulatory Visit (HOSPITAL_COMMUNITY): Payer: Medicare Other | Admitting: Certified Registered"

## 2023-01-23 ENCOUNTER — Ambulatory Visit (HOSPITAL_BASED_OUTPATIENT_CLINIC_OR_DEPARTMENT_OTHER): Payer: Medicare Other | Admitting: Certified Registered"

## 2023-01-23 DIAGNOSIS — I10 Essential (primary) hypertension: Secondary | ICD-10-CM

## 2023-01-23 DIAGNOSIS — M5416 Radiculopathy, lumbar region: Secondary | ICD-10-CM | POA: Diagnosis not present

## 2023-01-23 DIAGNOSIS — Z79899 Other long term (current) drug therapy: Secondary | ICD-10-CM | POA: Diagnosis not present

## 2023-01-23 DIAGNOSIS — M5417 Radiculopathy, lumbosacral region: Secondary | ICD-10-CM | POA: Diagnosis not present

## 2023-01-23 DIAGNOSIS — M7138 Other bursal cyst, other site: Secondary | ICD-10-CM | POA: Diagnosis not present

## 2023-01-23 DIAGNOSIS — M5136 Other intervertebral disc degeneration, lumbar region: Secondary | ICD-10-CM | POA: Diagnosis not present

## 2023-01-23 DIAGNOSIS — M5116 Intervertebral disc disorders with radiculopathy, lumbar region: Secondary | ICD-10-CM | POA: Diagnosis not present

## 2023-01-23 HISTORY — PX: LUMBAR LAMINECTOMY/DECOMPRESSION MICRODISCECTOMY: SHX5026

## 2023-01-23 SURGERY — LUMBAR LAMINECTOMY/DECOMPRESSION MICRODISCECTOMY 1 LEVEL
Anesthesia: General | Site: Back | Laterality: Left

## 2023-01-23 MED ORDER — ROCURONIUM BROMIDE 10 MG/ML (PF) SYRINGE
PREFILLED_SYRINGE | INTRAVENOUS | Status: DC | PRN
  Administered 2023-01-23 (×2): 50 mg via INTRAVENOUS

## 2023-01-23 MED ORDER — ACETAMINOPHEN 325 MG PO TABS: 650.0000 mg | ORAL_TABLET | ORAL | Status: DC | PRN

## 2023-01-23 MED ORDER — LACTATED RINGERS IV SOLN: INTRAVENOUS | Status: DC

## 2023-01-23 MED ORDER — BUPIVACAINE HCL (PF) 0.25 % IJ SOLN
INTRAMUSCULAR | Status: AC
  Filled 2023-01-23: qty 30

## 2023-01-23 MED ORDER — CYCLOBENZAPRINE HCL 10 MG PO TABS: 10.0000 mg | ORAL_TABLET | Freq: Three times a day (TID) | ORAL | 0 refills | Status: AC | PRN

## 2023-01-23 MED ORDER — SCOPOLAMINE 1 MG/3DAYS TD PT72: 1.0000 | MEDICATED_PATCH | TRANSDERMAL | Status: DC

## 2023-01-23 MED ORDER — IRBESARTAN 150 MG PO TABS
300.0000 mg | ORAL_TABLET | Freq: Every day | ORAL | Status: DC
  Administered 2023-01-23: 300 mg via ORAL
  Filled 2023-01-23: qty 2

## 2023-01-23 MED ORDER — PROPOFOL 10 MG/ML IV BOLUS
INTRAVENOUS | Status: DC | PRN
  Administered 2023-01-23: 130 mg via INTRAVENOUS

## 2023-01-23 MED ORDER — SODIUM CHLORIDE 0.9% FLUSH
3.0000 mL | Freq: Two times a day (BID) | INTRAVENOUS | Status: DC
  Administered 2023-01-23: 3 mL via INTRAVENOUS

## 2023-01-23 MED ORDER — PHENOL 1.4 % MT LIQD: 1.0000 | OROMUCOSAL | Status: DC | PRN

## 2023-01-23 MED ORDER — ROCURONIUM BROMIDE 10 MG/ML (PF) SYRINGE
PREFILLED_SYRINGE | INTRAVENOUS | Status: AC
  Filled 2023-01-23: qty 10

## 2023-01-23 MED ORDER — KETOROLAC TROMETHAMINE 30 MG/ML IJ SOLN
INTRAMUSCULAR | Status: DC | PRN
  Administered 2023-01-23: 30 mg via INTRAVENOUS

## 2023-01-23 MED ORDER — KETOROLAC TROMETHAMINE 30 MG/ML IJ SOLN
INTRAMUSCULAR | Status: AC
  Filled 2023-01-23: qty 1

## 2023-01-23 MED ORDER — ONDANSETRON HCL 4 MG/2ML IJ SOLN
INTRAMUSCULAR | Status: AC
  Filled 2023-01-23: qty 2

## 2023-01-23 MED ORDER — SODIUM CHLORIDE 0.9 % IV SOLN
250.0000 mL | INTRAVENOUS | Status: DC
  Administered 2023-01-23: 250 mL via INTRAVENOUS

## 2023-01-23 MED ORDER — CHLORHEXIDINE GLUCONATE 0.12 % MT SOLN
15.0000 mL | Freq: Once | OROMUCOSAL | Status: AC
  Administered 2023-01-23: 15 mL via OROMUCOSAL
  Filled 2023-01-23: qty 15

## 2023-01-23 MED ORDER — ORAL CARE MOUTH RINSE: 15.0000 mL | Freq: Once | OROMUCOSAL | Status: AC

## 2023-01-23 MED ORDER — ONDANSETRON HCL 4 MG/2ML IJ SOLN: 4.0000 mg | Freq: Four times a day (QID) | INTRAMUSCULAR | Status: DC | PRN

## 2023-01-23 MED ORDER — OXYCODONE HCL 5 MG/5ML PO SOLN: 5.0000 mg | Freq: Once | ORAL | Status: DC | PRN

## 2023-01-23 MED ORDER — 0.9 % SODIUM CHLORIDE (POUR BTL) OPTIME
TOPICAL | Status: DC | PRN
  Administered 2023-01-23: 1000 mL

## 2023-01-23 MED ORDER — BUPIVACAINE HCL (PF) 0.25 % IJ SOLN
INTRAMUSCULAR | Status: DC | PRN
  Administered 2023-01-23: 20 mL

## 2023-01-23 MED ORDER — HYDROMORPHONE HCL 1 MG/ML IJ SOLN: 0.2500 mg | INTRAMUSCULAR | Status: DC | PRN

## 2023-01-23 MED ORDER — CEFAZOLIN SODIUM-DEXTROSE 1-4 GM/50ML-% IV SOLN: 1.0000 g | Freq: Three times a day (TID) | INTRAVENOUS | Status: DC

## 2023-01-23 MED ORDER — ONDANSETRON HCL 4 MG/2ML IJ SOLN
INTRAMUSCULAR | Status: DC | PRN
  Administered 2023-01-23: 4 mg via INTRAVENOUS

## 2023-01-23 MED ORDER — DEXAMETHASONE SODIUM PHOSPHATE 10 MG/ML IJ SOLN
INTRAMUSCULAR | Status: DC | PRN
  Administered 2023-01-23: 5 mg via INTRAVENOUS

## 2023-01-23 MED ORDER — LIDOCAINE 2% (20 MG/ML) 5 ML SYRINGE
INTRAMUSCULAR | Status: AC
  Filled 2023-01-23: qty 5

## 2023-01-23 MED ORDER — PROPOFOL 10 MG/ML IV BOLUS
INTRAVENOUS | Status: AC
  Filled 2023-01-23: qty 20

## 2023-01-23 MED ORDER — HYDROMORPHONE HCL 1 MG/ML IJ SOLN: 1.0000 mg | INTRAMUSCULAR | Status: DC | PRN

## 2023-01-23 MED ORDER — CHLORHEXIDINE GLUCONATE CLOTH 2 % EX PADS: 6.0000 | MEDICATED_PAD | Freq: Once | CUTANEOUS | Status: DC

## 2023-01-23 MED ORDER — THROMBIN (RECOMBINANT) 5000 UNITS EX SOLR
CUTANEOUS | Status: DC | PRN
  Administered 2023-01-23: 1 via TOPICAL

## 2023-01-23 MED ORDER — ONDANSETRON HCL 4 MG PO TABS: 4.0000 mg | ORAL_TABLET | Freq: Four times a day (QID) | ORAL | Status: DC | PRN

## 2023-01-23 MED ORDER — FENTANYL CITRATE (PF) 250 MCG/5ML IJ SOLN
INTRAMUSCULAR | Status: DC | PRN
  Administered 2023-01-23 (×2): 50 ug via INTRAVENOUS

## 2023-01-23 MED ORDER — CENTRUM SILVER 50+WOMEN PO TABS: 1.0000 | ORAL_TABLET | Freq: Every day | ORAL | Status: DC

## 2023-01-23 MED ORDER — HYDROCODONE-ACETAMINOPHEN 10-325 MG PO TABS: 1.0000 | ORAL_TABLET | ORAL | Status: DC | PRN

## 2023-01-23 MED ORDER — CEFAZOLIN SODIUM-DEXTROSE 2-4 GM/100ML-% IV SOLN
2.0000 g | INTRAVENOUS | Status: AC
  Administered 2023-01-23: 2 g via INTRAVENOUS
  Filled 2023-01-23: qty 100

## 2023-01-23 MED ORDER — MENTHOL 3 MG MT LOZG: 1.0000 | LOZENGE | OROMUCOSAL | Status: DC | PRN

## 2023-01-23 MED ORDER — ACETAMINOPHEN 500 MG PO TABS
1000.0000 mg | ORAL_TABLET | Freq: Once | ORAL | Status: AC
  Administered 2023-01-23: 1000 mg via ORAL
  Filled 2023-01-23: qty 2

## 2023-01-23 MED ORDER — MEPERIDINE HCL 25 MG/ML IJ SOLN: 6.2500 mg | INTRAMUSCULAR | Status: DC | PRN

## 2023-01-23 MED ORDER — VASOPRESSIN 20 UNIT/ML IV SOLN
INTRAVENOUS | Status: AC
  Filled 2023-01-23: qty 1

## 2023-01-23 MED ORDER — KETOROLAC TROMETHAMINE 15 MG/ML IJ SOLN
30.0000 mg | Freq: Four times a day (QID) | INTRAMUSCULAR | Status: DC
  Administered 2023-01-23: 30 mg via INTRAVENOUS
  Filled 2023-01-23: qty 2

## 2023-01-23 MED ORDER — FENTANYL CITRATE (PF) 250 MCG/5ML IJ SOLN
INTRAMUSCULAR | Status: AC
  Filled 2023-01-23: qty 5

## 2023-01-23 MED ORDER — ADULT MULTIVITAMIN W/MINERALS CH: 1.0000 | ORAL_TABLET | Freq: Every day | ORAL | Status: DC

## 2023-01-23 MED ORDER — SUGAMMADEX SODIUM 200 MG/2ML IV SOLN
INTRAVENOUS | Status: DC | PRN
  Administered 2023-01-23: 200 mg via INTRAVENOUS

## 2023-01-23 MED ORDER — OLMESARTAN MEDOXOMIL-HCTZ 40-12.5 MG PO TABS: 1.0000 | ORAL_TABLET | Freq: Every day | ORAL | Status: DC

## 2023-01-23 MED ORDER — MIDAZOLAM HCL 2 MG/2ML IJ SOLN: 0.5000 mg | Freq: Once | INTRAMUSCULAR | Status: DC | PRN

## 2023-01-23 MED ORDER — PHENYLEPHRINE HCL-NACL 20-0.9 MG/250ML-% IV SOLN
INTRAVENOUS | Status: DC | PRN
  Administered 2023-01-23: 50 ug/min via INTRAVENOUS

## 2023-01-23 MED ORDER — HYDROCODONE-ACETAMINOPHEN 5-325 MG PO TABS: 1.0000 | ORAL_TABLET | ORAL | Status: DC | PRN

## 2023-01-23 MED ORDER — PHENYLEPHRINE 80 MCG/ML (10ML) SYRINGE FOR IV PUSH (FOR BLOOD PRESSURE SUPPORT)
PREFILLED_SYRINGE | INTRAVENOUS | Status: DC | PRN
  Administered 2023-01-23 (×3): 160 ug via INTRAVENOUS

## 2023-01-23 MED ORDER — HYDROCHLOROTHIAZIDE 12.5 MG PO TABS
12.5000 mg | ORAL_TABLET | Freq: Every day | ORAL | Status: DC
  Administered 2023-01-23: 12.5 mg via ORAL
  Filled 2023-01-23: qty 1

## 2023-01-23 MED ORDER — PROMETHAZINE HCL 25 MG/ML IJ SOLN: 6.2500 mg | INTRAMUSCULAR | Status: DC | PRN

## 2023-01-23 MED ORDER — HYDROCODONE-ACETAMINOPHEN 5-325 MG PO TABS: 1.0000 | ORAL_TABLET | ORAL | 0 refills | Status: AC | PRN

## 2023-01-23 MED ORDER — ACETAMINOPHEN 650 MG RE SUPP: 650.0000 mg | RECTAL | Status: DC | PRN

## 2023-01-23 MED ORDER — OXYCODONE HCL 5 MG PO TABS: 5.0000 mg | ORAL_TABLET | Freq: Once | ORAL | Status: DC | PRN

## 2023-01-23 MED ORDER — PHENYLEPHRINE 80 MCG/ML (10ML) SYRINGE FOR IV PUSH (FOR BLOOD PRESSURE SUPPORT)
PREFILLED_SYRINGE | INTRAVENOUS | Status: AC
  Filled 2023-01-23: qty 10

## 2023-01-23 MED ORDER — MIDAZOLAM HCL 2 MG/2ML IJ SOLN
INTRAMUSCULAR | Status: AC
  Filled 2023-01-23: qty 2

## 2023-01-23 MED ORDER — MIDAZOLAM HCL 2 MG/2ML IJ SOLN
INTRAMUSCULAR | Status: DC | PRN
  Administered 2023-01-23: 2 mg via INTRAVENOUS

## 2023-01-23 MED ORDER — THROMBIN 5000 UNITS EX SOLR
CUTANEOUS | Status: AC
  Filled 2023-01-23: qty 10000

## 2023-01-23 MED ORDER — CYCLOBENZAPRINE HCL 10 MG PO TABS: 10.0000 mg | ORAL_TABLET | Freq: Three times a day (TID) | ORAL | Status: DC | PRN

## 2023-01-23 MED ORDER — SODIUM CHLORIDE 0.9% FLUSH: 3.0000 mL | INTRAVENOUS | Status: DC | PRN

## 2023-01-23 MED ORDER — LIDOCAINE 2% (20 MG/ML) 5 ML SYRINGE
INTRAMUSCULAR | Status: DC | PRN
  Administered 2023-01-23: 80 mg via INTRAVENOUS

## 2023-01-23 MED ORDER — DEXAMETHASONE SODIUM PHOSPHATE 10 MG/ML IJ SOLN
INTRAMUSCULAR | Status: AC
  Filled 2023-01-23: qty 1

## 2023-01-23 SURGICAL SUPPLY — 47 items
ADH SKN CLS APL DERMABOND .7 (GAUZE/BANDAGES/DRESSINGS) ×1
APL SKNCLS STERI-STRIP NONHPOA (GAUZE/BANDAGES/DRESSINGS) ×1
BAG COUNTER SPONGE SURGICOUNT (BAG) ×1 IMPLANT
BAG DECANTER FOR FLEXI CONT (MISCELLANEOUS) ×1 IMPLANT
BAG SPNG CNTER NS LX DISP (BAG) ×1
BENZOIN TINCTURE PRP APPL 2/3 (GAUZE/BANDAGES/DRESSINGS) ×1 IMPLANT
BLADE CLIPPER SURG (BLADE) IMPLANT
BUR CUTTER 7.0 ROUND (BURR) ×1 IMPLANT
CANISTER SUCT 3000ML PPV (MISCELLANEOUS) ×1 IMPLANT
DERMABOND ADVANCED .7 DNX12 (GAUZE/BANDAGES/DRESSINGS) ×1 IMPLANT
DRAPE HALF SHEET 40X57 (DRAPES) IMPLANT
DRAPE LAPAROTOMY 100X72X124 (DRAPES) ×1 IMPLANT
DRAPE MICROSCOPE SLANT 54X150 (MISCELLANEOUS) ×1 IMPLANT
DRAPE SURG 17X23 STRL (DRAPES) ×2 IMPLANT
DRSG OPSITE POSTOP 4X6 (GAUZE/BANDAGES/DRESSINGS) IMPLANT
DURAPREP 26ML APPLICATOR (WOUND CARE) ×1 IMPLANT
ELECT REM PT RETURN 9FT ADLT (ELECTROSURGICAL) ×1
ELECTRODE REM PT RTRN 9FT ADLT (ELECTROSURGICAL) ×1 IMPLANT
GAUZE 4X4 16PLY ~~LOC~~+RFID DBL (SPONGE) IMPLANT
GAUZE SPONGE 4X4 12PLY STRL (GAUZE/BANDAGES/DRESSINGS) ×1 IMPLANT
GLOVE BIO SURGEON STRL SZ 6.5 (GLOVE) ×1 IMPLANT
GLOVE BIOGEL PI IND STRL 6.5 (GLOVE) ×1 IMPLANT
GLOVE ECLIPSE 9.0 STRL (GLOVE) ×1 IMPLANT
GLOVE EXAM NITRILE XL STR (GLOVE) IMPLANT
GOWN STRL REUS W/ TWL LRG LVL3 (GOWN DISPOSABLE) IMPLANT
GOWN STRL REUS W/ TWL XL LVL3 (GOWN DISPOSABLE) ×1 IMPLANT
GOWN STRL REUS W/TWL 2XL LVL3 (GOWN DISPOSABLE) IMPLANT
GOWN STRL REUS W/TWL LRG LVL3 (GOWN DISPOSABLE)
GOWN STRL REUS W/TWL XL LVL3 (GOWN DISPOSABLE) ×1
KIT BASIN OR (CUSTOM PROCEDURE TRAY) ×1 IMPLANT
KIT TURNOVER KIT B (KITS) ×1 IMPLANT
NDL SPNL 22GX3.5 QUINCKE BK (NEEDLE) IMPLANT
NEEDLE HYPO 22GX1.5 SAFETY (NEEDLE) ×1 IMPLANT
NEEDLE SPNL 22GX3.5 QUINCKE BK (NEEDLE) IMPLANT
NS IRRIG 1000ML POUR BTL (IV SOLUTION) ×1 IMPLANT
PACK LAMINECTOMY NEURO (CUSTOM PROCEDURE TRAY) ×1 IMPLANT
PAD ARMBOARD 7.5X6 YLW CONV (MISCELLANEOUS) ×3 IMPLANT
SOL ELECTROSURG ANTI STICK (MISCELLANEOUS) ×1
SOLUTION ELECTROSURG ANTI STCK (MISCELLANEOUS) ×1 IMPLANT
SPIKE FLUID TRANSFER (MISCELLANEOUS) ×1 IMPLANT
SPONGE SURGIFOAM ABS GEL SZ50 (HEMOSTASIS) ×1 IMPLANT
STRIP CLOSURE SKIN 1/2X4 (GAUZE/BANDAGES/DRESSINGS) ×1 IMPLANT
SUT VIC AB 2-0 CT1 18 (SUTURE) ×1 IMPLANT
SUT VIC AB 3-0 SH 8-18 (SUTURE) ×1 IMPLANT
TOWEL GREEN STERILE (TOWEL DISPOSABLE) ×1 IMPLANT
TOWEL GREEN STERILE FF (TOWEL DISPOSABLE) ×1 IMPLANT
WATER STERILE IRR 1000ML POUR (IV SOLUTION) ×1 IMPLANT

## 2023-01-23 NOTE — Transfer of Care (Signed)
Immediate Anesthesia Transfer of Care Note  Patient: Tina Singh  Procedure(s) Performed: Laminectomy for facet/synovial cyst - left - Lumbar five-Sacral one (Left: Back)  Patient Location: PACU  Anesthesia Type:General  Level of Consciousness: awake and alert   Airway & Oxygen Therapy: Patient Spontanous Breathing and Patient connected to face mask oxygen  Post-op Assessment: Report given to RN and Post -op Vital signs reviewed and stable  Post vital signs: Reviewed and stable  Last Vitals:  Vitals Value Taken Time  BP 116/57 01/23/23 1254  Temp    Pulse 78 01/23/23 1259  Resp 10 01/23/23 1259  SpO2 97 % 01/23/23 1259  Vitals shown include unvalidated device data.  Last Pain:  Vitals:   01/23/23 1033  TempSrc:   PainSc: 0-No pain         Complications: No notable events documented.

## 2023-01-23 NOTE — Anesthesia Postprocedure Evaluation (Signed)
Anesthesia Post Note  Patient: Tina Singh  Procedure(s) Performed: Laminectomy for facet/synovial cyst - left - Lumbar five-Sacral one (Left: Back)     Patient location during evaluation: PACU Anesthesia Type: General Level of consciousness: awake and alert, patient cooperative and oriented Pain management: pain level controlled Vital Signs Assessment: post-procedure vital signs reviewed and stable Respiratory status: spontaneous breathing, nonlabored ventilation and respiratory function stable Cardiovascular status: blood pressure returned to baseline and stable Postop Assessment: no apparent nausea or vomiting Anesthetic complications: no   No notable events documented.  Last Vitals:  Vitals:   01/23/23 1345 01/23/23 1403  BP: (!) 110/52 (!) 148/74  Pulse: 66 73  Resp: 14 20  Temp: 36.7 C 36.8 C  SpO2: 99% 100%    Last Pain:  Vitals:   01/23/23 1403  TempSrc: Oral  PainSc:                  Arabia Nylund,E. Raihan Kimmel

## 2023-01-23 NOTE — Discharge Summary (Signed)
Physician Discharge Summary  Patient ID: Tina Singh MRN: [id number] DOB/AGE: 05-01-1956 67 y.o.  Admit date: 01/23/2023 Discharge date: 01/23/2023  Admission Diagnoses:  Discharge Diagnoses:  Principal Problem:   Synovial cyst of lumbar facet joint   Discharged Condition: good  Hospital Course: Patient admitted to the hospital where she underwent uncomplicated left-sided L5-S1 laminotomy and resection of synovial cyst.  Postoperatively doing very well.  No lower extremity pain.  Standing ambulating and voiding without difficulty.  Ready for discharge home.  Consults:   Significant Diagnostic Studies:   Treatments:   Discharge Exam: Blood pressure (!) 148/74, pulse 73, temperature 98.3 F (36.8 C), temperature source Oral, resp. rate 20, height 5\' 7"  (1.702 m), weight 75.3 kg, SpO2 100 %. Awake and alert.  Oriented and appropriate.  Motor and sensory function intact.  Wound clean and dry.  Chest and abdomen benign.  Disposition: Discharge disposition: 01-Home or Self Care        Allergies as of 01/23/2023   No Known Allergies      Medication List     TAKE these medications    CALCIUM PO Take 1 tablet by mouth daily.   Centrum Silver 50+Women Tabs Take 1 tablet by mouth daily.   cyclobenzaprine 10 MG tablet Commonly known as: FLEXERIL Take 1 tablet (10 mg total) by mouth 3 (three) times daily as needed for muscle spasms.   HYDROcodone-acetaminophen 5-325 MG tablet Commonly known as: NORCO/VICODIN Take 1 tablet by mouth every 4 (four) hours as needed for moderate pain ((score 4 to 6)).   olmesartan-hydrochlorothiazide 40-12.5 MG tablet Commonly known as: BENICAR HCT Take 1 tablet by mouth daily.   VITAMIN C PO Take 1 tablet by mouth daily.        Follow-up Information     Julio Sicks, MD. Call.   Specialty: Neurosurgery Why: As needed, If symptoms worsen Contact information: [address] [place]  [address] [phone]                 Signed: Temple Pacini 01/23/2023, 4:35 PM

## 2023-01-23 NOTE — Discharge Instructions (Signed)
Wound Care Keep incision covered and dry for three days.  Do not put any creams, lotions, or ointments on incision. Leave steri-strips on back.  They will fall off by themselves. Activity Walk each and every day, increasing distance each day. No lifting greater than 8 lbs.  Avoid excessive back bending No driving for 2 weeks; may ride as a passenger locally.  Diet Resume your normal diet.  Return to Work Will be discussed at you follow up appointment. Call Your Doctor If Any of These Occur Redness, drainage, or swelling at the wound.  Temperature greater than 101 degrees. Severe pain not relieved by pain medication. Incision starts to come apart. Follow Up Appt Call  [phone])  for problems.  If you have any hardware placed in your spine, you will need an x-ray before your appointment.  

## 2023-01-23 NOTE — Evaluation (Signed)
Occupational Therapy Evaluation Patient Details Name: Tina Singh MRN: [id number] DOB: Jun 10, 1956 Today's Date: 01/23/2023   History of Present Illness 66 yo F s/p left-sided L5-S1 laminotomy and resection of synovial cyst.  PMH includes arthritis and HTN.   Clinical Impression   Patient admitted for the diagnosis above.  PTA she lives with her spouse and needed no assist with ADL,iADL or mobility.  Primary c/o numbness to lateral aspect of L foot.  Patient is essentially at her baseline, verbalizes good understanding of all precautions, and will have assist as needed from her spouse.  No further OT needs in the acute setting.  Recommend follow up as prescribed by MD.        Recommendations for follow up therapy are one component of a multi-disciplinary discharge planning process, led by the attending physician.  Recommendations may be updated based on patient status, additional functional criteria and insurance authorization.   Assistance Recommended at Discharge PRN  Patient can return home with the following Assist for transportation;Assistance with cooking/housework    Functional Status Assessment  Patient has not had a recent decline in their functional status  Equipment Recommendations  None recommended by OT    Recommendations for Other Services       Precautions / Restrictions Precautions Precautions: Back Precaution Booklet Issued: Yes (comment) Restrictions Weight Bearing Restrictions: No      Mobility Bed Mobility Overal bed mobility: Modified Independent                  Transfers Overall transfer level: Independent                        Balance Overall balance assessment: No apparent balance deficits (not formally assessed)                                         ADL either performed or assessed with clinical judgement   ADL Overall ADL's : At baseline                                              Vision Patient Visual Report: No change from baseline       Perception     Praxis      Pertinent Vitals/Pain Pain Assessment Pain Assessment: No/denies pain     Hand Dominance Right   Extremity/Trunk Assessment Upper Extremity Assessment Upper Extremity Assessment: Overall WFL for tasks assessed   Lower Extremity Assessment Lower Extremity Assessment: Overall WFL for tasks assessed   Cervical / Trunk Assessment Cervical / Trunk Assessment: Back Surgery   Communication Communication Communication: No difficulties   Cognition Arousal/Alertness: Awake/alert Behavior During Therapy: WFL for tasks assessed/performed Overall Cognitive Status: Within Functional Limits for tasks assessed                                       General Comments   VSS on RA    Exercises     Shoulder Instructions      Home Living Family/patient expects to be discharged to:: Private residence Living Arrangements: Spouse/significant other;Parent Available Help at Discharge: Family;Available 24 hours/day Type of Home: House Home Access: Stairs to enter Entrance  Stairs-Number of Steps: 4-5 Entrance Stairs-Rails: Can reach both;Left;Right Home Layout: Multi-level;Laundry or work area in basement;Able to live on main level with bedroom/bathroom     Bathroom Shower/Tub: Producer, television/film/video: Pharmacist, community: Yes How Accessible: Accessible via walker Home Equipment: None          Prior Functioning/Environment Prior Level of Function : Independent/Modified Independent;Driving                        OT Problem List: Pain      OT Treatment/Interventions:      OT Goals(Current goals can be found in the care plan section) Acute Rehab OT Goals Patient Stated Goal: Return home OT Goal Formulation: With patient Time For Goal Achievement: 01/27/23 Potential to Achieve Goals: Good  OT Frequency:      Co-evaluation               AM-PAC OT "6 Clicks" Daily Activity     Outcome Measure Help from another person eating meals?: None Help from another person taking care of personal grooming?: None Help from another person toileting, which includes using toliet, bedpan, or urinal?: None Help from another person bathing (including washing, rinsing, drying)?: None Help from another person to put on and taking off regular upper body clothing?: None Help from another person to put on and taking off regular lower body clothing?: None 6 Click Score: 24   End of Session Nurse Communication: Mobility status  Activity Tolerance: Patient tolerated treatment well Patient left: in chair;with call bell/phone within reach;with family/visitor present  OT Visit Diagnosis: Unsteadiness on feet (R26.81)                Time: 1610-9604 OT Time Calculation (min): 24 min Charges:  OT General Charges $OT Visit: 1 Visit OT Evaluation $OT Eval Moderate Complexity: 1 Mod OT Treatments $Self Care/Home Management : 8-22 mins  01/23/2023  RP, OTR/L  Acute Rehabilitation Services  Office:  [phone]   Suzanna Obey 01/23/2023, 4:48 PM

## 2023-01-23 NOTE — Anesthesia Procedure Notes (Signed)
Procedure Name: Intubation Date/Time: 01/23/2023 11:39 AM  Performed by: Alwyn Ren, CRNAPre-anesthesia Checklist: Patient identified, Emergency Drugs available, Suction available and Patient being monitored Patient Re-evaluated:Patient Re-evaluated prior to induction Oxygen Delivery Method: Circle system utilized Preoxygenation: Pre-oxygenation with 100% oxygen Induction Type: IV induction Ventilation: Mask ventilation without difficulty Laryngoscope Size: 2, Miller, Mac, 3 and Glidescope Grade View: Grade I Tube type: Oral Tube size: 7.0 mm Number of attempts: 1 Airway Equipment and Method: Stylet and Oral airway Placement Confirmation: ETT inserted through vocal cords under direct vision, positive ETCO2 and breath sounds checked- equal and bilateral Secured at: 21 cm Tube secured with: Tape Dental Injury: Teeth and Oropharynx as per pre-operative assessment  Comments: Attempted with first 2 blades and then glidescope lo pro 3. Anterior view with Hyacinth Meeker and MAC

## 2023-01-23 NOTE — Op Note (Signed)
Date  of procedure: 01/23/2023  Date of dictation: Same  Service: Neurosurgery  Preoperative diagnosis: Left L5-S1 adherent synovial cyst with radiculopathy  Postoperative diagnosis: Same  Procedure Name: Left L5-S1 laminotomy and resection of synovial cyst, microdissection  Surgeon:Leona Pressly A.Pratt Bress, M.D.  Asst. Surgeon: Doran Durand, NP  Anesthesia: General  Indication: 67 year old female with left lower extremity radicular pain numbness and weakness consistent with a left-sided S1 radiculopathy which is failed conservative management.  Workup demonstrates evidence for large left-sided L5-S1 synovial cyst without associated spondylolisthesis or severe changes consistent with instability.  Patient presents now for laminotomy and resection of cyst.  Operative note: After induction of anesthesia, patient positioned prone onto a Wilson frame and properly padded.  Lumbar region prepped and draped sterilely.  Incision made overlying L5-S1.  Dissection performed on the left.  Retractor placed.  X-ray taken.  Level confirmed.  Laminotomy then performed using high-speed drill and Kerrison rongeurs to remove the inferior aspect of the lamina of L5 the medial aspect of the L5-S1 facet joint and the superior aspect of the S1 lamina.  Ligament flavum elevated and resected.  There was an obvious large adherent synovial cyst.  Microscope was then brought into the field used for microdissection.  The laminotomy was widened slightly.  The cyst was detached from its cephalad margin and carefully peeled along the course of the left S1 nerve root and thecal sac.  The laminotomy and foraminotomies were white and somewhat inferiorly to obtain circumferential dissection of the cyst.  This was then removed and several large pieces without evidence of injury to the thecal sac or nerve roots.  Complete cyst resection was achieved.  The disc base was examined and found to be free from associated disc herniation or other significant  problem.  The wound was irrigated.  Gelfoam was placed topically for hemostasis.  Wound was then closed in layers with Vicryl sutures.  Steri-Strips and sterile dressing were applied.  No apparent complications.  Patient tolerated the procedure well and she returns to the recovery room postop.

## 2023-01-23 NOTE — Anesthesia Preprocedure Evaluation (Addendum)
Anesthesia Evaluation  Patient identified by MRN, date of birth, ID band Patient awake    Reviewed: Allergy & Precautions, NPO status , Patient's Chart, lab work & pertinent test results  History of Anesthesia Complications Negative for: history of anesthetic complications  Airway Mallampati: II  TM Distance: >3 FB Neck ROM: Full    Dental  (+) Dental Advisory Given   Pulmonary neg pulmonary ROS   breath sounds clear to auscultation       Cardiovascular hypertension, Pt. on medications (-) angina  Rhythm:Regular Rate:Normal     Neuro/Psych negative neurological ROS     GI/Hepatic negative GI ROS, Neg liver ROS,,,  Endo/Other  negative endocrine ROS    Renal/GU negative Renal ROS     Musculoskeletal  (+) Arthritis ,    Abdominal   Peds  Hematology negative hematology ROS (+)   Anesthesia Other Findings   Reproductive/Obstetrics                             Anesthesia Physical Anesthesia Plan  ASA: 2  Anesthesia Plan: General   Post-op Pain Management: Tylenol PO (pre-op)*   Induction: Intravenous  PONV Risk Score and Plan: 3 and Ondansetron, Dexamethasone and Scopolamine patch - Pre-op  Airway Management Planned: Oral ETT  Additional Equipment: None  Intra-op Plan:   Post-operative Plan: Extubation in OR  Informed Consent: I have reviewed the patients History and Physical, chart, labs and discussed the procedure including the risks, benefits and alternatives for the proposed anesthesia with the patient or authorized representative who has indicated his/her understanding and acceptance.     Dental advisory given  Plan Discussed with: CRNA and Surgeon  Anesthesia Plan Comments:         Anesthesia Quick Evaluation

## 2023-01-23 NOTE — Plan of Care (Signed)

## 2023-01-23 NOTE — Brief Op Note (Signed)
01/23/2023  12:40 PM  PATIENT:  Tina Singh  67 y.o. female  PRE-OPERATIVE DIAGNOSIS:  Synovial cyst  POST-OPERATIVE DIAGNOSIS:  Synovial cyst  PROCEDURE:  Procedure(s): Laminectomy for facet/synovial cyst - left - Lumbar five-Sacral one (Left)  SURGEON:  Surgeon(s) and Role:    Julio Sicks, MD - Primary  PHYSICIAN ASSISTANT:   ASSISTANTSMarland Mcalpine   ANESTHESIA:   general  EBL:  25 mL   BLOOD ADMINISTERED:none  DRAINS: none   LOCAL MEDICATIONS USED:  MARCAINE     SPECIMEN:  No Specimen  DISPOSITION OF SPECIMEN:  N/A  COUNTS:  YES  TOURNIQUET:  * No tourniquets in log *  DICTATION: .Dragon Dictation  PLAN OF CARE: Admit for overnight observation  PATIENT DISPOSITION:  PACU - hemodynamically stable.   Delay start of Pharmacological VTE agent (>24hrs) due to surgical blood loss or risk of bleeding: yes

## 2023-01-23 NOTE — H&P (Signed)
  Tina Singh is an 67 y.o. female.   Chief Complaint: Left leg pain HPI: 67 year old female with severe left lower extremity radicular pain failing conservative management.  Symptoms consistent with a left-sided S1 radiculopathy.  Workup demonstrates evidence of lumbar degenerative disease with a left-sided L5-S1 synovial cyst with marked compression of the left S1 nerve root.  Patient has failed conservative management presents now for decompression and resection of cyst.  Past Medical History:  Diagnosis Date   Arthritis    Hypertension     Past Surgical History:  Procedure Laterality Date   CATARACT EXTRACTION W/ INTRAOCULAR LENS IMPLANT Bilateral    TONSILLECTOMY     as a child    History reviewed. No pertinent family history. Social History:  reports that she has never smoked. She has never used smokeless tobacco. She reports current alcohol use of about 3.0 - 4.0 standard drinks of alcohol per week. She reports that she does not use drugs.  Allergies: No Known Allergies  Medications Prior to Admission  Medication Sig Dispense Refill   Ascorbic Acid (VITAMIN C PO) Take 1 tablet by mouth daily.     CALCIUM PO Take 1 tablet by mouth daily.     Multiple Vitamins-Minerals (CENTRUM SILVER 50+WOMEN) TABS Take 1 tablet by mouth daily.     olmesartan-hydrochlorothiazide (BENICAR HCT) 40-12.5 MG tablet Take 1 tablet by mouth daily.      No results found for this or any previous visit (from the past 48 hour(s)). No results found.  Pertinent items noted in HPI and remainder of comprehensive ROS otherwise negative.  Blood pressure (!) 156/76, pulse 90, temperature 98.5 F (36.9 C), temperature source Oral, resp. rate 18, height 5\' 7"  (1.702 m), weight 75.3 kg, SpO2 97 %.  Patient is awake and alert.  She is oriented and appropriate.  Speech is fluent.  Judgment insight are intact.  Cranial nerve function normal bilateral.  Motor examination reveals intact motor strength bilaterally  except her left gastrocnemius muscle is 4-/5.  Sensory examination with decrease sensation pinprick light touch in her left S1 dermatome.  Deep tendon reflexes normal active except her left Achilles reflexes absent.  Gait is antalgic.  There is an positive on the left equivocal on the right.  Gait antalgic.  Patient looks examination head ears eyes nose and throat is unremarked.  Chest and abdomen are benign.  Extremities are free from injury deformity. Assessment/Plan Left L5-S1 synovial cyst with radiculopathy.  Plan left L5-S1 laminotomy and resection of synovial cyst.  Risks and benefits been explained.  Patient wishes to proceed.  Kathaleen Maser Kohana Amble 01/23/2023, 10:41 AM

## 2023-01-23 NOTE — Progress Notes (Signed)
Patient alert and oriented, mae's well, voiding adequate amount of urine, swallowing without difficulty, no c/o pain at time of discharge. Patient discharged home with spouse. Script and discharged instructions given to patient. Patient and spouse stated understanding of instructions given. Patient has an appointment with Dr. Jordan Likes.

## 2023-01-24 ENCOUNTER — Encounter: Payer: Self-pay | Admitting: *Deleted

## 2023-01-24 ENCOUNTER — Encounter (HOSPITAL_COMMUNITY): Payer: Self-pay | Admitting: Neurosurgery

## 2023-01-24 MED FILL — Thrombin For Soln 5000 Unit: CUTANEOUS | Qty: 2 | Status: AC

## 2023-02-28 DIAGNOSIS — H35033 Hypertensive retinopathy, bilateral: Secondary | ICD-10-CM | POA: Diagnosis not present

## 2023-04-19 ENCOUNTER — Other Ambulatory Visit (HOSPITAL_COMMUNITY): Payer: Self-pay | Admitting: Internal Medicine

## 2023-04-19 DIAGNOSIS — R918 Other nonspecific abnormal finding of lung field: Secondary | ICD-10-CM

## 2023-04-26 DIAGNOSIS — M79622 Pain in left upper arm: Secondary | ICD-10-CM | POA: Diagnosis not present

## 2023-04-26 DIAGNOSIS — M25532 Pain in left wrist: Secondary | ICD-10-CM | POA: Diagnosis not present

## 2023-05-02 DIAGNOSIS — S52502A Unspecified fracture of the lower end of left radius, initial encounter for closed fracture: Secondary | ICD-10-CM | POA: Diagnosis not present

## 2023-05-09 DIAGNOSIS — S52502A Unspecified fracture of the lower end of left radius, initial encounter for closed fracture: Secondary | ICD-10-CM | POA: Diagnosis not present

## 2023-05-25 ENCOUNTER — Ambulatory Visit (HOSPITAL_COMMUNITY): Payer: Medicare Other

## 2023-05-26 DIAGNOSIS — S52502D Unspecified fracture of the lower end of left radius, subsequent encounter for closed fracture with routine healing: Secondary | ICD-10-CM | POA: Diagnosis not present

## 2023-06-13 DIAGNOSIS — S52502A Unspecified fracture of the lower end of left radius, initial encounter for closed fracture: Secondary | ICD-10-CM | POA: Diagnosis not present

## 2023-06-15 DIAGNOSIS — U071 COVID-19: Secondary | ICD-10-CM | POA: Diagnosis not present

## 2023-06-20 ENCOUNTER — Ambulatory Visit (HOSPITAL_COMMUNITY): Payer: Medicare Other

## 2023-07-01 ENCOUNTER — Ambulatory Visit (HOSPITAL_COMMUNITY)
Admission: RE | Admit: 2023-07-01 | Discharge: 2023-07-01 | Disposition: A | Discharge status: Home / Self Care | Payer: Medicare Other | Source: Ambulatory Visit | Attending: Internal Medicine | Admitting: Internal Medicine

## 2023-07-01 DIAGNOSIS — R918 Other nonspecific abnormal finding of lung field: Secondary | ICD-10-CM | POA: Insufficient documentation

## 2023-07-01 DIAGNOSIS — I700 Atherosclerosis of aorta: Secondary | ICD-10-CM | POA: Diagnosis not present

## 2023-07-01 DIAGNOSIS — K449 Diaphragmatic hernia without obstruction or gangrene: Secondary | ICD-10-CM | POA: Diagnosis not present

## 2023-07-04 DIAGNOSIS — Z23 Encounter for immunization: Secondary | ICD-10-CM | POA: Diagnosis not present

## 2023-07-10 DIAGNOSIS — E782 Mixed hyperlipidemia: Secondary | ICD-10-CM | POA: Diagnosis not present

## 2023-07-10 DIAGNOSIS — R7303 Prediabetes: Secondary | ICD-10-CM | POA: Diagnosis not present

## 2023-07-11 DIAGNOSIS — S52502A Unspecified fracture of the lower end of left radius, initial encounter for closed fracture: Secondary | ICD-10-CM | POA: Diagnosis not present

## 2023-07-18 DIAGNOSIS — J302 Other seasonal allergic rhinitis: Secondary | ICD-10-CM | POA: Diagnosis not present

## 2023-07-18 DIAGNOSIS — M1711 Unilateral primary osteoarthritis, right knee: Secondary | ICD-10-CM | POA: Diagnosis not present

## 2023-07-18 DIAGNOSIS — R7303 Prediabetes: Secondary | ICD-10-CM | POA: Diagnosis not present

## 2023-07-18 DIAGNOSIS — R918 Other nonspecific abnormal finding of lung field: Secondary | ICD-10-CM | POA: Diagnosis not present

## 2023-07-18 DIAGNOSIS — I129 Hypertensive chronic kidney disease with stage 1 through stage 4 chronic kidney disease, or unspecified chronic kidney disease: Secondary | ICD-10-CM | POA: Diagnosis not present

## 2023-07-18 DIAGNOSIS — M545 Low back pain, unspecified: Secondary | ICD-10-CM | POA: Diagnosis not present

## 2023-07-18 DIAGNOSIS — I700 Atherosclerosis of aorta: Secondary | ICD-10-CM | POA: Diagnosis not present

## 2023-07-18 DIAGNOSIS — E559 Vitamin D deficiency, unspecified: Secondary | ICD-10-CM | POA: Diagnosis not present

## 2023-07-18 DIAGNOSIS — E782 Mixed hyperlipidemia: Secondary | ICD-10-CM | POA: Diagnosis not present

## 2023-07-18 DIAGNOSIS — M8589 Other specified disorders of bone density and structure, multiple sites: Secondary | ICD-10-CM | POA: Diagnosis not present

## 2023-07-18 DIAGNOSIS — N1831 Chronic kidney disease, stage 3a: Secondary | ICD-10-CM | POA: Diagnosis not present

## 2023-07-18 DIAGNOSIS — S52502D Unspecified fracture of the lower end of left radius, subsequent encounter for closed fracture with routine healing: Secondary | ICD-10-CM | POA: Diagnosis not present

## 2023-07-20 ENCOUNTER — Encounter: Payer: Self-pay | Admitting: *Deleted

## 2023-08-10 DIAGNOSIS — N189 Chronic kidney disease, unspecified: Secondary | ICD-10-CM | POA: Diagnosis not present

## 2023-08-10 DIAGNOSIS — Z79899 Other long term (current) drug therapy: Secondary | ICD-10-CM | POA: Diagnosis not present

## 2023-08-10 DIAGNOSIS — Z713 Dietary counseling and surveillance: Secondary | ICD-10-CM | POA: Diagnosis not present

## 2023-08-10 DIAGNOSIS — I129 Hypertensive chronic kidney disease with stage 1 through stage 4 chronic kidney disease, or unspecified chronic kidney disease: Secondary | ICD-10-CM | POA: Diagnosis not present

## 2023-08-10 DIAGNOSIS — Z6826 Body mass index (BMI) 26.0-26.9, adult: Secondary | ICD-10-CM | POA: Diagnosis not present

## 2023-08-22 DIAGNOSIS — G5602 Carpal tunnel syndrome, left upper limb: Secondary | ICD-10-CM | POA: Diagnosis not present

## 2023-08-22 DIAGNOSIS — S52502A Unspecified fracture of the lower end of left radius, initial encounter for closed fracture: Secondary | ICD-10-CM | POA: Diagnosis not present

## 2023-09-07 DIAGNOSIS — G5602 Carpal tunnel syndrome, left upper limb: Secondary | ICD-10-CM | POA: Diagnosis not present

## 2023-09-07 DIAGNOSIS — M6598 Unspecified synovitis and tenosynovitis, other site: Secondary | ICD-10-CM | POA: Diagnosis not present

## 2023-09-07 DIAGNOSIS — M24232 Disorder of ligament, left wrist: Secondary | ICD-10-CM | POA: Diagnosis not present

## 2023-09-12 DIAGNOSIS — H1045 Other chronic allergic conjunctivitis: Secondary | ICD-10-CM | POA: Diagnosis not present

## 2023-12-25 ENCOUNTER — Other Ambulatory Visit (HOSPITAL_COMMUNITY): Payer: Self-pay | Admitting: Internal Medicine

## 2023-12-25 DIAGNOSIS — Z780 Asymptomatic menopausal state: Secondary | ICD-10-CM

## 2023-12-25 DIAGNOSIS — Z1231 Encounter for screening mammogram for malignant neoplasm of breast: Secondary | ICD-10-CM

## 2024-01-10 DIAGNOSIS — E782 Mixed hyperlipidemia: Secondary | ICD-10-CM | POA: Diagnosis not present

## 2024-01-10 DIAGNOSIS — R7303 Prediabetes: Secondary | ICD-10-CM | POA: Diagnosis not present

## 2024-01-10 DIAGNOSIS — E559 Vitamin D deficiency, unspecified: Secondary | ICD-10-CM | POA: Diagnosis not present

## 2024-01-15 ENCOUNTER — Ambulatory Visit (HOSPITAL_COMMUNITY)
Admission: RE | Admit: 2024-01-15 | Discharge: 2024-01-15 | Disposition: A | Discharge status: Home / Self Care | Source: Ambulatory Visit | Attending: Internal Medicine | Admitting: Internal Medicine

## 2024-01-15 DIAGNOSIS — Z1231 Encounter for screening mammogram for malignant neoplasm of breast: Secondary | ICD-10-CM | POA: Insufficient documentation

## 2024-01-15 DIAGNOSIS — M8589 Other specified disorders of bone density and structure, multiple sites: Secondary | ICD-10-CM | POA: Diagnosis not present

## 2024-01-15 DIAGNOSIS — Z780 Asymptomatic menopausal state: Secondary | ICD-10-CM | POA: Diagnosis not present

## 2024-01-16 DIAGNOSIS — E559 Vitamin D deficiency, unspecified: Secondary | ICD-10-CM | POA: Diagnosis not present

## 2024-01-16 DIAGNOSIS — N1831 Chronic kidney disease, stage 3a: Secondary | ICD-10-CM | POA: Diagnosis not present

## 2024-01-16 DIAGNOSIS — J302 Other seasonal allergic rhinitis: Secondary | ICD-10-CM | POA: Diagnosis not present

## 2024-01-16 DIAGNOSIS — R918 Other nonspecific abnormal finding of lung field: Secondary | ICD-10-CM | POA: Diagnosis not present

## 2024-01-16 DIAGNOSIS — I700 Atherosclerosis of aorta: Secondary | ICD-10-CM | POA: Diagnosis not present

## 2024-01-16 DIAGNOSIS — I129 Hypertensive chronic kidney disease with stage 1 through stage 4 chronic kidney disease, or unspecified chronic kidney disease: Secondary | ICD-10-CM | POA: Diagnosis not present

## 2024-01-16 DIAGNOSIS — M1711 Unilateral primary osteoarthritis, right knee: Secondary | ICD-10-CM | POA: Diagnosis not present

## 2024-01-16 DIAGNOSIS — S52502D Unspecified fracture of the lower end of left radius, subsequent encounter for closed fracture with routine healing: Secondary | ICD-10-CM | POA: Diagnosis not present

## 2024-01-16 DIAGNOSIS — M8589 Other specified disorders of bone density and structure, multiple sites: Secondary | ICD-10-CM | POA: Diagnosis not present

## 2024-01-16 DIAGNOSIS — M545 Low back pain, unspecified: Secondary | ICD-10-CM | POA: Diagnosis not present

## 2024-01-16 DIAGNOSIS — E782 Mixed hyperlipidemia: Secondary | ICD-10-CM | POA: Diagnosis not present

## 2024-01-16 DIAGNOSIS — R7303 Prediabetes: Secondary | ICD-10-CM | POA: Diagnosis not present

## 2024-01-17 ENCOUNTER — Encounter (INDEPENDENT_AMBULATORY_CARE_PROVIDER_SITE_OTHER): Payer: Self-pay | Admitting: *Deleted

## 2024-03-05 DIAGNOSIS — H35031 Hypertensive retinopathy, right eye: Secondary | ICD-10-CM | POA: Diagnosis not present

## 2024-06-12 DIAGNOSIS — R7303 Prediabetes: Secondary | ICD-10-CM | POA: Diagnosis not present

## 2024-06-12 DIAGNOSIS — E782 Mixed hyperlipidemia: Secondary | ICD-10-CM | POA: Diagnosis not present

## 2024-06-12 DIAGNOSIS — E559 Vitamin D deficiency, unspecified: Secondary | ICD-10-CM | POA: Diagnosis not present

## 2024-06-19 ENCOUNTER — Other Ambulatory Visit (HOSPITAL_COMMUNITY): Payer: Self-pay | Admitting: Internal Medicine

## 2024-06-19 ENCOUNTER — Encounter (INDEPENDENT_AMBULATORY_CARE_PROVIDER_SITE_OTHER): Payer: Self-pay | Admitting: *Deleted

## 2024-06-19 DIAGNOSIS — R7303 Prediabetes: Secondary | ICD-10-CM | POA: Diagnosis not present

## 2024-06-19 DIAGNOSIS — M8589 Other specified disorders of bone density and structure, multiple sites: Secondary | ICD-10-CM | POA: Diagnosis not present

## 2024-06-19 DIAGNOSIS — M545 Low back pain, unspecified: Secondary | ICD-10-CM | POA: Diagnosis not present

## 2024-06-19 DIAGNOSIS — J302 Other seasonal allergic rhinitis: Secondary | ICD-10-CM | POA: Diagnosis not present

## 2024-06-19 DIAGNOSIS — M1711 Unilateral primary osteoarthritis, right knee: Secondary | ICD-10-CM | POA: Diagnosis not present

## 2024-06-19 DIAGNOSIS — I700 Atherosclerosis of aorta: Secondary | ICD-10-CM | POA: Diagnosis not present

## 2024-06-19 DIAGNOSIS — Z23 Encounter for immunization: Secondary | ICD-10-CM | POA: Diagnosis not present

## 2024-06-19 DIAGNOSIS — I129 Hypertensive chronic kidney disease with stage 1 through stage 4 chronic kidney disease, or unspecified chronic kidney disease: Secondary | ICD-10-CM | POA: Diagnosis not present

## 2024-06-19 DIAGNOSIS — Z0001 Encounter for general adult medical examination with abnormal findings: Secondary | ICD-10-CM | POA: Diagnosis not present

## 2024-06-19 DIAGNOSIS — N1831 Chronic kidney disease, stage 3a: Secondary | ICD-10-CM | POA: Diagnosis not present

## 2024-06-19 DIAGNOSIS — R918 Other nonspecific abnormal finding of lung field: Secondary | ICD-10-CM | POA: Diagnosis not present

## 2024-06-19 DIAGNOSIS — E782 Mixed hyperlipidemia: Secondary | ICD-10-CM | POA: Diagnosis not present

## 2024-07-08 DIAGNOSIS — Z1211 Encounter for screening for malignant neoplasm of colon: Secondary | ICD-10-CM | POA: Diagnosis not present

## 2024-07-09 ENCOUNTER — Ambulatory Visit (HOSPITAL_COMMUNITY)
Admission: RE | Admit: 2024-07-09 | Discharge: 2024-07-09 | Disposition: A | Discharge status: Home / Self Care | Source: Ambulatory Visit | Attending: Internal Medicine | Admitting: Internal Medicine

## 2024-07-09 DIAGNOSIS — I700 Atherosclerosis of aorta: Secondary | ICD-10-CM | POA: Diagnosis not present

## 2024-07-09 DIAGNOSIS — R918 Other nonspecific abnormal finding of lung field: Secondary | ICD-10-CM | POA: Diagnosis not present

## 2024-07-13 LAB — COLOGUARD: COLOGUARD: NEGATIVE
